# Patient Record
Sex: Male | Born: 1955
Health system: Southern US, Community
[De-identification: ages and names within clinical notes are randomized; demographics above are authoritative.]

## PROBLEM LIST (undated history)

## (undated) HISTORY — PX: OTHER SURGICAL HISTORY: SHX169

## (undated) HISTORY — PX: ROTATOR CUFF REPAIR: SHX139

---

## 2002-09-03 ENCOUNTER — Ambulatory Visit (HOSPITAL_COMMUNITY): Admission: RE | Admit: 2002-09-03 | Discharge: 2002-09-03 | Payer: Self-pay | Admitting: Internal Medicine

## 2004-11-14 ENCOUNTER — Ambulatory Visit (HOSPITAL_COMMUNITY): Admission: RE | Admit: 2004-11-14 | Discharge: 2004-11-14 | Payer: Self-pay | Admitting: Internal Medicine

## 2004-11-17 ENCOUNTER — Ambulatory Visit (HOSPITAL_COMMUNITY): Admission: RE | Admit: 2004-11-17 | Discharge: 2004-11-17 | Payer: Self-pay | Admitting: Internal Medicine

## 2004-11-24 ENCOUNTER — Ambulatory Visit: Payer: Self-pay | Admitting: Orthopedic Surgery

## 2005-01-05 ENCOUNTER — Ambulatory Visit: Payer: Self-pay | Admitting: Orthopedic Surgery

## 2005-01-10 ENCOUNTER — Encounter (HOSPITAL_COMMUNITY): Admission: RE | Admit: 2005-01-10 | Discharge: 2005-02-09 | Payer: Self-pay | Admitting: Orthopedic Surgery

## 2005-02-13 ENCOUNTER — Encounter (HOSPITAL_COMMUNITY): Admission: RE | Admit: 2005-02-13 | Discharge: 2005-03-15 | Payer: Self-pay | Admitting: Orthopedic Surgery

## 2005-02-16 ENCOUNTER — Ambulatory Visit: Payer: Self-pay | Admitting: Orthopedic Surgery

## 2005-02-21 ENCOUNTER — Ambulatory Visit: Payer: Self-pay | Admitting: Orthopedic Surgery

## 2005-02-21 ENCOUNTER — Ambulatory Visit (HOSPITAL_COMMUNITY): Admission: RE | Admit: 2005-02-21 | Discharge: 2005-02-21 | Payer: Self-pay | Admitting: Orthopedic Surgery

## 2005-02-27 ENCOUNTER — Encounter (HOSPITAL_COMMUNITY): Admission: RE | Admit: 2005-02-27 | Discharge: 2005-03-29 | Payer: Self-pay | Admitting: Orthopedic Surgery

## 2005-02-27 ENCOUNTER — Ambulatory Visit: Payer: Self-pay | Admitting: Orthopedic Surgery

## 2005-03-20 ENCOUNTER — Ambulatory Visit: Payer: Self-pay | Admitting: Orthopedic Surgery

## 2005-04-05 ENCOUNTER — Encounter (HOSPITAL_COMMUNITY): Admission: RE | Admit: 2005-04-05 | Discharge: 2005-05-05 | Payer: Self-pay | Admitting: Orthopedic Surgery

## 2005-04-12 ENCOUNTER — Ambulatory Visit: Payer: Self-pay | Admitting: Orthopedic Surgery

## 2005-05-05 ENCOUNTER — Encounter (HOSPITAL_COMMUNITY): Admission: RE | Admit: 2005-05-05 | Discharge: 2005-06-04 | Payer: Self-pay | Admitting: Orthopedic Surgery

## 2005-05-25 ENCOUNTER — Ambulatory Visit: Payer: Self-pay | Admitting: Orthopedic Surgery

## 2005-06-07 ENCOUNTER — Encounter (HOSPITAL_COMMUNITY): Admission: RE | Admit: 2005-06-07 | Discharge: 2005-07-07 | Payer: Self-pay | Admitting: Orthopedic Surgery

## 2005-06-22 ENCOUNTER — Ambulatory Visit: Payer: Self-pay | Admitting: Orthopedic Surgery

## 2005-07-13 ENCOUNTER — Ambulatory Visit: Payer: Self-pay | Admitting: Orthopedic Surgery

## 2008-01-29 ENCOUNTER — Ambulatory Visit: Payer: Self-pay | Admitting: Cardiology

## 2008-01-30 ENCOUNTER — Ambulatory Visit: Payer: Self-pay | Admitting: Cardiology

## 2008-01-30 ENCOUNTER — Encounter (HOSPITAL_COMMUNITY): Admission: RE | Admit: 2008-01-30 | Discharge: 2008-02-29 | Payer: Self-pay | Admitting: Cardiology

## 2008-02-03 ENCOUNTER — Ambulatory Visit: Payer: Self-pay | Admitting: Cardiology

## 2008-02-03 ENCOUNTER — Encounter: Payer: Self-pay | Admitting: Cardiology

## 2008-03-12 ENCOUNTER — Ambulatory Visit: Payer: Self-pay | Admitting: Internal Medicine

## 2008-03-16 ENCOUNTER — Ambulatory Visit (HOSPITAL_COMMUNITY): Admission: RE | Admit: 2008-03-16 | Discharge: 2008-03-16 | Payer: Self-pay | Admitting: Internal Medicine

## 2008-03-16 ENCOUNTER — Encounter: Payer: Self-pay | Admitting: Internal Medicine

## 2008-03-16 ENCOUNTER — Ambulatory Visit: Payer: Self-pay | Admitting: Internal Medicine

## 2008-04-15 ENCOUNTER — Ambulatory Visit: Payer: Self-pay | Admitting: Internal Medicine

## 2010-08-16 NOTE — Assessment & Plan Note (Signed)
[facility]                       [place] CARDIOLOGY OFFICE NOTE   James [name], James [name]                     MRN:          [id number]  DATE:01/29/2008                            DOB:          [birth year]    REQUESTING PHYSICIAN:  Kingsley Callander. Ouida Sills, MD   REASON FOR CONSULTATION:  Chest pain.   HISTORY OF PRESENT ILLNESS:  James [name] is a very pleasant 55 year old  male with a history of mild hyperlipidemia with LDL cholesterol of 140,  random glucose back in June of 102, and a family history of premature  cardiovascular disease.  His father underwent coronary artery bypass  surgery in his 68s, and I also see he had problems with pulmonary  fibrosis as well.  He has no personal history of diabetes mellitus or  hypertension and otherwise reports no major functional limitations.  He  and his wife have been in the middle of a move over the last few weeks,  and he has been experiencing a fairly sporadic knife-like intermittent  chest pain that is not predictably reproduced and typically lasts for  only a few seconds at a time.  Sometimes these pains will shoot up into  his neck or lip or into his shoulder blade or left arm, but never  persists more than a few seconds and have not gotten any worse when he  exerts himself, even lifting while moving.  One or two times during this  course he has felt a heaviness in his chest when he lies down.  He  reports a normal standard treadmill test approximately 4 years ago,  although he has had no interval testing.  He otherwise has no problems  with cough or hemoptysis, orthopnea, PND, palpitations, lower extremity  edema, or syncope.   ALLERGIES:  No known drug allergies.   PRESENT MEDICATIONS:  Aspirin 81 mg p.o. daily.   PAST MEDICAL HISTORY:  He is status post previous left rotator cuff  repair.  He has a history of microhematuria with negative workup, also  history of elevated transaminases with subsequent  normalization.   SOCIAL HISTORY:  The patient is married.  He and his wife have recently  been living with their daughter waiting on remodeling of a home to which  they have recently moved.  He denies any tobacco or alcohol use.  Previously, he ran regularly up to 5 or 6 miles a day, although is not  doing that anymore.   FAMILY HISTORY:  As reviewed above.  His mother had a history of  diabetes and hypertension, and he has a sister with history of Hodgkin  disease.   REVIEW OF SYSTEMS:  As outlined above.  He wears contacts.  Otherwise,  systems are negative.   PHYSICAL EXAMINATION:  VITAL SIGNS:  Blood pressure 126/78, heart rate  is 69, weight is 166 pounds.  GENERAL:  The patient is normally nourished, in no acute distress.  HEENT:  Conjunctiva is normal.  Oropharynx is clear.  NECK:  Supple.  No elevated jugular venous pressure.  No loud bruits.  No thyromegaly.  LUNGS:  Clear without labored breathing.  CARDIAC:  A regular rate and rhythm.  No pathologic murmur, S3, gallop,  or pericardial rub.  ABDOMEN:  Soft, nontender.  Normal bowel sounds.  EXTREMITIES:  Exhibit no significant pitting edemas.  Distal pulses are  2+.  SKIN:  Warm and dry.  MUSCULOSKELETAL:  No kyphosis noted.  NEUROPSYCHIATRIC:  The patient is alert and oriented x3.  Affect is  appropriate.   Review of electrocardiogram done just recently on January 26, 2008,  finds sinus rhythm at 60 beats per minute with normal intervals.   IMPRESSION AND RECOMMENDATIONS:  Atypical chest pain syndrome in a 24-  year-old male with mild hyperlipidemia and family history of premature  cardiovascular disease affecting his father.  These symptoms are noted  with the recent stress of moving, although are very concerning to him  given his father's history.  He has not had any ischemic workup over the  last 4 years.  There is also history of pulmonary fibrosis in his  father, although James [name] lung exam is fairly  benign.  He is not  having any cough or hemoptysis.  We talked about the matter today, and  our plan will be an exercise Myoview to be scheduled tomorrow along with  a resting echocardiogram mainly to exclude left ventricle wall motion  abnormalities and assess pulmonary pressures.  If these studies are  normal, I will plan to get him back to see Dr. Ouida Sills for further  evaluation, otherwise we can review the situation further and discuss  other diagnostic options.     Jonelle Sidle, MD  Electronically Signed    SGM/MedQ  DD: 01/29/2008  DT: 01/30/2008  Job #: [id number]   cc:   Kingsley Callander. Ouida Sills, MD

## 2010-08-16 NOTE — Assessment & Plan Note (Signed)
NAMEBIAGIO, SNELSON               CHART#:  [id number]   DATE:  04/15/2008                       DOB:  Feb 27, 1956   CHIEF COMPLAINT:  Followup of procedure.   SUBJECTIVE:  The patient is here for followup visit.  He has a 96-month  history of atypical chest pain with negative cardiac workup.  He also  had some vague odynophagia and dysphagia as well.  He underwent an EGD  for these symptoms as well as a colonoscopy for paper hematochezia  recently.  His esophagus was normal on EGD.  He had a Maloney dilator  passed, 54-French.  He had biopsies taken of the esophagus, which were  negative for eosinophilic esophagitis.  Colonoscopy revealed normal  rectum and terminal ileum.  He was given the 2-week trial of Kapidex.  He states he really did not notice any significant changes in his  symptoms.  He has only had 1 or 2 episodes of chest pain since he was  here last.  One time occurred after eating very spicy food and lasted  for about 2 days.  Denies any diaphoresis or shortness of breath with  these episodes.  He denies any abdominal pain.  No longer have any  problem swallowing.  Denies typical heartburn symptoms.  Symptoms seem  to have settled down since he was last here.  Bowel movements are  regular.  No blood in the stool or melena.   CURRENT MEDICATIONS:  See updated list.   ALLERGIES:  No known drug allergies.   PHYSICAL EXAMINATION:  VITAL SIGNS:  Weight 172, stable; temp 97.2;  blood pressure 120/88; and pulse 76.  GENERAL:  A pleasant, well-nourished, well-developed, Caucasian  gentleman in no acute distress.  SKIN:  Warm and dry.  No jaundice.  HEENT:  Sclerae nonicteric.  ABDOMEN:  Soft.   IMPRESSION:  1. The patient is a 55 year old gentleman with several months' history      of atypical chest pain.  Pain may be related to gastroesophageal      reflux disease.  He took a 2-week trial of Kapidex without any      overwhelming improvement of his symptoms.  He notes,  however, he      has had minimal symptoms since this procedure.  We discussed today      that if he has symptoms 2 or 3 times a week, I would like for him      to be on Kapidex or other PPIs such as Prilosec OTC or Prevacid OTC      on a daily basis for a couple of months to see if they helps or      not.  Would also consider gallbladder ultrasound to rule out occult      gallbladder disease if his symptoms become more recurrent or      progressive.  At this point in time, overall he is doing much      better.  Would also advise antireflux measures, which we discussed      today.  He has some      changes that he can make within his diet.  2. Office visit on a p.r.n. basis.       Tana Coast, P.A.  Electronically Signed     R. Roetta Sessions, M.D.  Electronically Signed  LL/MEDQ  D:  04/15/2008  T:  04/16/2008  Job:  [id number]   cc:   Kingsley Callander. Ouida Sills, MD

## 2010-08-16 NOTE — H&P (Signed)
NAMEPRIMUS, GRITTON              ACCOUNT NO.:  [id number]   MEDICAL RECORD NO.:  [id number]          PATIENT TYPE:  AMB   LOCATION:  DAY                           FACILITY:  [facility]   PHYSICIAN:  R. Roetta Sessions, M.D. DATE OF BIRTH:  Apr 09, 1955   DATE OF ADMISSION:  DATE OF DISCHARGE:  LH                              HISTORY & PHYSICAL   REASON FOR CONSULTATION:  Atypical chest pain.  The further evaluation  also screening colonoscopy.   HISTORY OF PRESENT ILLNESS:  Mr. Onofrio Klemp is a very pleasant 55-  year-old Caucasian male who resides here in [place], sent over  courtesy of Dr. Carylon Perches to further evaluate 79-month history of chest  discomfort.  He has had intermittent squeezing chest tightness, pressure  like sensation throughout his mid chest.  It waxed and waned without any  particular activity including exertion or changing of position, not  affected at all by eating or fasting.  He saw [facility] and had  an echo and nuclear stress test without any cardiac etiology being  found.  He does occasionally has some vague odynophagia symptoms as well  as dysphagia to solids and pills.  He really does not have any typical  symptoms of reflux, including retrosternal burning or regurgitation.  He  does not use tobacco or alcohol products.  He has not been on any acid  suppression therapy lately.  He denies any weight loss.  He has never  had his upper GI tract evaluated.  There is no prior history of  gastrointestinal illness.  It is of note, Mr. Arocho tells me that for  the past five or 6 years, he does sometimes wipe a slight amount of  blood after having a bowel movement.  He has never had any bleeding  around the stool or in the toilet water.  He has never had his lower GI  tract evaluated.  There is no family history of GI malignancy in any  first-degree relatives, although one of his grandmothers may have had  colon cancer.  He has never had his lower GI tract  evaluated.   LABORATORY EVALUATION:  Dr. Alonza Smoker office back in June of this year.  CBC completely normal.  Chem 20 normal except for slightly elevated  blood sugar of 102.  Total cholesterol 203.  LDL 140, HDL 45.  Urinalysis, trace blood.  PSA 0.39.   PAST MEDICAL HISTORY:  Benign recurrent hematuria at least 30 years  duration per patient.   PAST SURGERIES:  Urological procedure in the past.  He has had left  shoulder surgery.   CURRENT PRESCRIBED MEDICATIONS:  None.   OVER-THE-COUNTER MEDICATIONS:  None.   ALLERGIES:  No known drug allergies although he states he has gotten  nauseated after ANESTHESIA on occasion previously.   FAMILY HISTORY:  Mother died at age 25 of fluid around heart, diabetes,  hypertension.  Father died with pulmonary fibrosis age 46.  Otherwise no  history of chronic GI or liver illness aside from a 1 grandmother  possibly having colon cancer.   SOCIAL HISTORY:  Patient married,  has 3 healthy biological children and  1 step-child.  He is in his second marriage.  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  No tobacco or alcohol.  No  illicit drugs.   REVIEW OF SYSTEMS:  Has not had any night sweats, fever, chills.  Really  has not had any abdominal pain whatsoever.  He occasionally has diarrhea  but usually has regular bowel movements.  Some rectal bleeding with  wiping as outlined above.   PHYSICAL EXAMINATION:  CONSTITUTIONAL:  A very pleasant 55 year old  gentleman resting comfortably.  VITAL SIGNS:  Weight 173, height 5 feet 6-1/2 inches.  Temperature 98,  BP 130/76, pulse 72.  SKIN:  Warm and dry.  HEENT:  No scleral icterus.  Conjunctivae pink.  CHEST:  Lungs are clear to auscultation.  CARDIOVASCULAR:  Regular rate and rhythm without murmur, rub.  ABDOMEN:  Nondistended.  Positive bowel sounds.  Entirely soft and  nontender without appreciable mass or organomegaly.  EXTREMITIES:  No edema.  RECTAL:  Exam is  deferred to the time of colonoscopy.   IMPRESSION:  Mr. Webb Weed is a very pleasant 55 year old gentleman  with 21-month history of atypical chest pain, not likely cardiac in  etiology.  I share Dr. Alonza Smoker concerns this may well be  gastroesophageal reflux or other gastrointestinal problem.  He describes  vague odynophagia and dysphagia as well.  Sometimes occult gallbladder  disease can manifest primarily as chest pain.  However, I am glad to see  the has had a negative cardiac evaluation.  He has never had his lower  gastrointestinal tract evaluated.  He has had paper hematochezia for  years.  This is likely coming from a benign source but I also agree with  Dr. Ouida Sills that he has now crossed the threshold age for which at least a  screening colonoscopy is warranted.   RECOMMENDATIONS:  I have offered Mr. Utter both an EGD and colonoscopy  at the same time in the near future at [facility].  Risks,  benefits, alternatives, and limitations have been reviewed.  I told Mr.  Leonetti if he did have a web stricture or ring amenable to dilation, I  would plan to dilate his esophagus at that time, as he is having  symptoms.  His questions were answered.  He was agreeable.  We will plan  to perform an endoscopic evaluation in the very near future at Mr.  Comas convenience.  Make further recommendations once that has been  done.   I would like to thank Dr. Carylon Perches for allowing me to see this very  nice gentleman today in consultation.      Jonathon Bellows, M.D.  Electronically Signed     RMR/MEDQ  D:  03/12/2008  T:  03/12/2008  Job:  [id number]

## 2010-08-16 NOTE — Op Note (Signed)
NAMEJAMESEN, STAHNKE              ACCOUNT NO.:  [id number]   MEDICAL RECORD NO.:  [id number]          PATIENT TYPE:  AMB   LOCATION:  DAY                           FACILITY:  [facility]   PHYSICIAN:  R. Roetta Sessions, M.D. DATE OF BIRTH:  Jun 18, 1955   DATE OF PROCEDURE:  DATE OF DISCHARGE:                               OPERATIVE REPORT   INDICATIONS FOR PROCEDURE:  A 55 year old gentleman with recent 76-month  history of atypical chest pain, vague dysphagia to solids.  He is here  for further evaluation via EGD.  Also, he is here for screening  colonoscopy, in fact has had some paper hematochezia on occasion.  There  is no family history of GI malignancy in the first-degree relatives  although, he cites one grandmother possibly having colon cancer.  EGD  and colonoscopy now being done, potential for esophageal dilation,  biopsy, etc reviewed.  Risks, benefits, alternatives and limitations  discussed previously and again at the bedside, all parties questions  answered, all parties agreeable.   PROCEDURE NOTE:  O2 saturation, blood pressure, pulse, respirations were  monitored throughout the entirety of the procedure.   CONSCIOUS SEDATION:  Versed 6 mg IV, Demerol 100 mg IV in divided doses,  Zofran 4 mg IV in a single dose as prophylaxis against post procedure  nausea.  Cetacaine spray for topical pharyngeal anesthesia.   INSTRUMENT:  Pentax video chip system.   FINDINGS:  EGD examination of the tubular esophagus revealed entirely  normal mucosa.  EGD junction is easily traversed.  Stomach:  Gastric  cavity was emptied and insufflated well with air.  Thorough examination  of the gastric mucosa including retroflexed view of the proximal stomach  esophagogastric junction demonstrated no abnormalities.  Pylorus was  patent and easily traversed.  Examination of the bulb and second portion  revealed no abnormalities.  Therapeutic/diagnostic maneuvers performed.  Scope was withdrawn.  A  54-French Maloney dilator was passed in full  insertion with ease.  A look back revealed really no trauma associated  with those maneuvers.  Subsequently, biopsies of the distal and  midesophageal body were taken for histology study.  Patient tolerated  this procedure well and was prepared for colonoscopy.  Digital rectal  exam revealed no abnormalities.  Endoscopic findings:  Prep was good.  Colon:  Colonic mucosa was surveyed from the rectosigmoid junction  through the left transverse, right colon to the appendiceal orifice,  ileocecal valve, and cecum with the structures well seen, photographed  for the record.  Terminal ileum was intubated 10 cm.  From this level,  scope was slowly and consciously withdrawn.  All previously mentioned  mucosal surfaces were again seen.  The colonic mucosa as well as the  terminal ileum mucosa appeared normal.  The scope was pulled down the  rectum.  A thorough examination of the rectal mucosa including the  retroflexed view of the anal verge demonstrated no abnormalities.  The  patient tolerated the above procedures well and was reactive to  Endoscopy.   IMPRESSION:  Normal esophagus and stomach, D1 and D2, status post  passage of a  Maloney dilator and biopsy of the esophagus as described  above.  Colonoscopy findings; normal rectum, terminal ileum.  I suspect  trivial anorectal bleeding producing paper hematochezia.   Today, we found no obstructing lesion in the esophagus.  There were no  inflammatory lesions and the mucosa did not appear consistent with  eosinophilic esophagitis.   RECOMMENDATIONS:  1. Two week course of Kapidex 60 mg orally daily.  He is to go by my      office for free samples.  2. Repeat screening colonoscopy, 10 years.  3. If he has persisting symptoms to follow up.  We will consider a      gallbladder ultrasound to rule out gallbladder disease to complete      his GI workup.      Jonathon Bellows, M.D.  Electronically  Signed     RMR/MEDQ  D:  03/16/2008  T:  03/17/2008  Job:  [id number]   cc:   Kingsley Callander. Ouida Sills, MD  Fax: [phone]

## 2010-08-19 NOTE — H&P (Signed)
NAMEHENNING, EHLE              ACCOUNT NO.:  [id number]   MEDICAL RECORD NO.:  [id number]          PATIENT TYPE:  AMB   LOCATION:  DAY                           FACILITY:  [facility]   PHYSICIAN:  Vickki Hearing, M.D.DATE OF BIRTH:  1955-08-08   DATE OF ADMISSION:  DATE OF DISCHARGE:  LH                                HISTORY & PHYSICAL   CHIEF COMPLAINT:  Left shoulder pain.   HISTORY:  He is a 55 year old who presented with gradual onset of atraumatic  mild to moderate constant dull aching pain in his left shoulder. He is right  hand dominant. He swims and exercises. He has pain with frontal lap pull  downs. On presentation August 2006, he had had pain for eight weeks.  Modifying factors include overhead activity and sleeping on his left side  which increases his pain, and he has had some associated swelling over the  upper lateral left arm.   REVIEW OF SYSTEMS:  Ten-point system review normal except for  musculoskeletal system.   ALLERGIES:  None.   PAST MEDICAL HISTORY:  No surgeries, medical problems, pharmacy medications.  He is noted to have diabetes. His family physician is Dr. Ouida Sills.   SOCIAL HISTORY:  He is married. He is an Orthoptist. He does not  smoke, drink or use caffeine. College two years.   PHYSICAL EXAMINATION:  VITAL SIGNS:  Weight is 145. Pulse is 76. Respiratory  rate is 16.  GENERAL:  He is thin with normal development, grooming, hygiene, and  nutrition. He is alert and oriented x3. Neurological shows normal sensation  and reflexes. His pulses are normal. He has no venous stasis or edema.  SKIN:  No rashes.  MUSCULOSKELETAL:  Left shoulder:  He has lost range of motion in internal  rotation and flexion. He has normal strength and normal stability. He has  tenderness over the [facility] joint. His Neer and Hawkins signs were positive. He  has a normal opposite extremity and some mild C spine tenderness. His lower  extremities have normal range of  motion, alignment, stability and strength.   STUDIES:  His MRI of the shoulder done November 17, 2004 shows rotator cuff  tendinopathy, calcification anterior supraspinatus, no tear, small antral  labral tear degenerative in nature, mild AC joint arthritis with down  sloping acromion.   PLAN:  Operative plan is for surgical arthroscopy, left shoulder, with  subacromial decompression. It does not look like we will need to actually  remove the [facility] joint because he did not have a positive cross-chest adduction  test.      Vickki Hearing, M.D.  Electronically Signed     SEH/MEDQ  D:  02/20/2005  T:  02/20/2005  Job:  [id number]

## 2010-08-19 NOTE — Group Therapy Note (Signed)
   [name], [name]                          ACCOUNT NO.:  [id number]   MEDICAL RECORD NO.:  [id number]                  PATIENT TYPE:  POUT   LOCATION:                                       FACILITY:   PHYSICIAN:  Kingsley Callander. Ouida Sills, M.D.                  DATE OF BIRTH:   DATE OF PROCEDURE:  09/03/2002  DATE OF DISCHARGE:                                    STRESS TEST   PROCEDURE:  The patient exercised 14 minutes 9 seconds (2 minutes 9 seconds  at stage 5 of the Bruce protocol), obtaining a maximal heart rate of 185  (106% of the age predicted maximal heart rate) at a work load of 15.1 METT  and discontinued exercise due to fatigue.  There were no symptoms of chest  pain.  There were no arrhythmias.  There were no ST segment changes  diagnostic of ischemia.  The baseline electrocardiogram revealed normal  sinus rhythm at 80 beats per minute with minor early repolarization changes  in the inferior leads.   IMPRESSION:  No evidence of exercise-induced ischemia.                                               Kingsley Callander. Ouida Sills, M.D.    ROF/MEDQ  D:  09/03/2002  T:  09/03/2002  Job:  [id number]

## 2010-08-19 NOTE — Op Note (Signed)
NAMEBRAVEN, WOLK              ACCOUNT NO.:  [id number]   MEDICAL RECORD NO.:  [id number]          PATIENT TYPE:  AMB   LOCATION:  DAY                           FACILITY:  [facility]   PHYSICIAN:  Vickki Hearing, M.D.DATE OF BIRTH:  10-12-1955   DATE OF PROCEDURE:  02/21/2005  DATE OF DISCHARGE:                                 OPERATIVE REPORT   PREOPERATIVE DIAGNOSIS:  Rotator cuff syndrome, left shoulder.   POSTOPERATIVE DIAGNOSIS:  Rotator cuff syndrome, left shoulder.   PROCEDURE:  Arthroscopic subacromial decompression, left shoulder.   SURGEON:  Dr. Romeo Apple. No assistants.   ANESTHETIC:  General.   SPECIMENS:  No specimens.   OPERATIVE FINDINGS:  There was mild irritation of the glenohumeral joint.  The rotator cuff intra-articular surface was normal. The biceps tendon  anchor and tendon were normal. Subacromial space had a thickened bursal  drape with inflammation and some fibrosis. The superior surface of the  rotator cuff was intact though looked diseased. AC joint was not affecting  the cuff. Mild acromial spur was noted.   HISTORY:  This is a 55 year old male. He was followed for left shoulder  pain. He did not have an injury. He had a dull, constant, aching pain on the  upper outer portion of his left arm. He had overhead activity related pain  and pain was sleeping on his left side. He failed conservative treatment  including a subacromial injection. He had a MRI August 17 which showed  rotator cuff tendinopathy, no tear, small anterior labral degenerative  changes, mild AC joint arthritis with downsloping acromion.   He was identified in preop holding area, left shoulder marked for surgery,  countersigned by the surgeon. History and physical updated. Antibiotics  started. He was taken to the operating room. He had general anesthesia. He  was placed in a modified beach-chair position. We took a time-out and  completed that as required and proceeded with a  posterior approach to the  shoulder. We made a portal and entered the glenohumeral joint and performed  a diagnostic arthroscopy   We were having some trouble with the pump function at that time, but it was  later corrected. We placed a scope in the subacromial space, encountered a  significantly thickened and fibrotic bursal drape with inflammation. We  debrided and resected the bursa until the rotator cuff was visualized. We  manipulated the arm in internal and external rotation to get a good view of  the cuff; it was intact. We performed a subacromial decompression of the  anterior acromion to the level of the [facility] joint. No planing was done. The [facility]  joint appeared to be normal from the subarticular side.   We then placed the scope back in the joint to get a better look at the  glenohumeral joint and found no disease of the supraspinatus tendon, biceps,  biceps anchor and just found inflammation of the joint.   We removed the scope, injected subacromial Marcaine with epinephrine  approximately 20 cc. We sewed the skin together with 3-0 Prolene. We placed  the patient in a CryoCuff  combination sling, extubated him and took him to  the recovery room in stable condition.   We will discharge him today from the recovery room. He will be placed on  Percocet 5 mg 1 every 4 hours #60, Skelaxin 800 mg 1 every 8 hours #90,  Phenergan 25 mg oral q.6h. #12 p.r.n. nausea. Follow-up will be scheduled  for Monday. We would like him to start therapy on Friday if it is available,  and if not, we will start that on the next available date after that.      Vickki Hearing, M.D.  Electronically Signed     SEH/MEDQ  D:  02/21/2005  T:  02/21/2005  Job:  [id number]

## 2011-03-15 ENCOUNTER — Encounter: Payer: Self-pay | Admitting: Cardiology

## 2012-01-12 ENCOUNTER — Other Ambulatory Visit (HOSPITAL_COMMUNITY): Payer: Self-pay | Admitting: Internal Medicine

## 2012-01-12 ENCOUNTER — Ambulatory Visit (HOSPITAL_COMMUNITY)
Admission: RE | Admit: 2012-01-12 | Discharge: 2012-01-12 | Disposition: A | Discharge status: Home / Self Care | Payer: 59 | Source: Ambulatory Visit | Attending: Internal Medicine | Admitting: Internal Medicine

## 2012-01-12 DIAGNOSIS — X58XXXA Exposure to other specified factors, initial encounter: Secondary | ICD-10-CM | POA: Insufficient documentation

## 2012-01-12 DIAGNOSIS — S92919A Unspecified fracture of unspecified toe(s), initial encounter for closed fracture: Secondary | ICD-10-CM | POA: Insufficient documentation

## 2012-01-12 DIAGNOSIS — M79671 Pain in right foot: Secondary | ICD-10-CM

## 2013-03-21 ENCOUNTER — Ambulatory Visit (HOSPITAL_COMMUNITY)
Admission: RE | Admit: 2013-03-21 | Discharge: 2013-03-21 | Disposition: A | Discharge status: Home / Self Care | Payer: 59 | Source: Ambulatory Visit | Attending: Internal Medicine | Admitting: Internal Medicine

## 2013-03-21 ENCOUNTER — Other Ambulatory Visit (HOSPITAL_COMMUNITY): Payer: Self-pay | Admitting: Internal Medicine

## 2013-03-21 DIAGNOSIS — R059 Cough, unspecified: Secondary | ICD-10-CM

## 2013-03-21 DIAGNOSIS — R0989 Other specified symptoms and signs involving the circulatory and respiratory systems: Secondary | ICD-10-CM | POA: Insufficient documentation

## 2013-03-21 DIAGNOSIS — R05 Cough: Secondary | ICD-10-CM

## 2013-03-21 DIAGNOSIS — M4120 Other idiopathic scoliosis, site unspecified: Secondary | ICD-10-CM | POA: Insufficient documentation

## 2015-05-21 ENCOUNTER — Encounter (HOSPITAL_COMMUNITY): Payer: Self-pay | Admitting: *Deleted

## 2015-05-21 ENCOUNTER — Emergency Department (HOSPITAL_COMMUNITY)
Admission: EM | Admit: 2015-05-21 | Discharge: 2015-05-21 | Disposition: A | Discharge status: Home / Self Care | Payer: BLUE CROSS/BLUE SHIELD | Attending: Emergency Medicine | Admitting: Emergency Medicine

## 2015-05-21 DIAGNOSIS — S6991XA Unspecified injury of right wrist, hand and finger(s), initial encounter: Secondary | ICD-10-CM | POA: Diagnosis present

## 2015-05-21 DIAGNOSIS — Y9389 Activity, other specified: Secondary | ICD-10-CM | POA: Insufficient documentation

## 2015-05-21 DIAGNOSIS — S61219A Laceration without foreign body of unspecified finger without damage to nail, initial encounter: Secondary | ICD-10-CM

## 2015-05-21 DIAGNOSIS — Z79899 Other long term (current) drug therapy: Secondary | ICD-10-CM | POA: Diagnosis not present

## 2015-05-21 DIAGNOSIS — Z87891 Personal history of nicotine dependence: Secondary | ICD-10-CM | POA: Insufficient documentation

## 2015-05-21 DIAGNOSIS — W260XXA Contact with knife, initial encounter: Secondary | ICD-10-CM | POA: Insufficient documentation

## 2015-05-21 DIAGNOSIS — S61216A Laceration without foreign body of right little finger without damage to nail, initial encounter: Secondary | ICD-10-CM | POA: Diagnosis not present

## 2015-05-21 DIAGNOSIS — Y998 Other external cause status: Secondary | ICD-10-CM | POA: Insufficient documentation

## 2015-05-21 DIAGNOSIS — Y9289 Other specified places as the place of occurrence of the external cause: Secondary | ICD-10-CM | POA: Insufficient documentation

## 2015-05-21 MED ORDER — LIDOCAINE HCL (PF) 1 % IJ SOLN
INTRAMUSCULAR | Status: AC
  Administered 2015-05-21: 22:00:00
  Filled 2015-05-21: qty 5

## 2015-05-21 MED ORDER — POVIDONE-IODINE 10 % EX SOLN
CUTANEOUS | Status: AC
  Administered 2015-05-21: 22:00:00
  Filled 2015-05-21: qty 118

## 2015-05-21 MED ORDER — ONDANSETRON 4 MG PO TBDP
4.0000 mg | ORAL_TABLET | Freq: Once | ORAL | Status: AC
  Administered 2015-05-21: 4 mg via ORAL
  Filled 2015-05-21: qty 1

## 2015-05-21 NOTE — ED Notes (Signed)
Pt was putting a knife down when the knife slipped, cutting left pinkie finger, bleeding controlled at triage,

## 2015-05-21 NOTE — ED Notes (Signed)
Dry dressing applied to right small finger

## 2015-05-21 NOTE — ED Provider Notes (Signed)
CSN: [id number]     Arrival date & time 05/21/15  2039 History   First MD Initiated Contact with Patient 05/21/15 2144     Chief Complaint  Patient presents with  . Laceration     (Consider location/radiation/quality/duration/timing/severity/associated sxs/prior Treatment) Patient is a 60 y.o. male presenting with skin laceration. The history is provided by the patient.  Laceration Location:  Hand Hand laceration location:  R finger Length (cm):  2.2 Depth:  Cutaneous Quality: straight   Bleeding: controlled   Laceration mechanism:  Knife Pain details:    Quality:  Aching   Severity:  Mild   Timing:  Intermittent   Progression:  Worsening Foreign body present:  No foreign bodies Worsened by:  Movement Tetanus status:  Up to date   Past Medical History  Diagnosis Date  . Chest pain    Past Surgical History  Procedure Laterality Date  . Rotator cuff repair      Left   No family history on file. Social History  Substance Use Topics  . Smoking status: Former Research scientist (life sciences)  . Smokeless tobacco: None  . Alcohol Use: No    Review of Systems  Constitutional: Negative for activity change.       All ROS Neg except as noted in HPI  HENT: Negative for nosebleeds.   Eyes: Negative for photophobia and discharge.  Respiratory: Negative for cough, shortness of breath and wheezing.   Cardiovascular: Negative for chest pain and palpitations.  Gastrointestinal: Negative for abdominal pain and blood in stool.  Genitourinary: Negative for dysuria, frequency and hematuria.  Musculoskeletal: Negative for back pain, arthralgias and neck pain.  Skin: Negative.   Neurological: Negative for dizziness, seizures and speech difficulty.  Psychiatric/Behavioral: Negative for hallucinations and confusion.      Allergies  Codeine  Home Medications   Prior to Admission medications   Medication Sig Start Date End Date Taking? Authorizing Provider  escitalopram (LEXAPRO) 10 MG tablet Take  10 mg by mouth daily. 05/06/15  Yes Historical Provider, MD  LORazepam (ATIVAN) 0.5 MG tablet Take 0.125 tablets by mouth daily as needed.  04/07/15  Yes Historical Provider, MD   BP 140/83 mmHg  Pulse 67  Temp(Src) 98.4 F (36.9 C) (Oral)  Resp 16  Ht 5\' 6"  (1.676 m)  Wt 68.493 kg  BMI 24.38 kg/m2  SpO2 99% Physical Exam  Constitutional: He is oriented to person, place, and time. He appears well-developed and well-nourished.  Non-toxic appearance.  HENT:  Head: Normocephalic.  Right Ear: Tympanic membrane and external ear normal.  Left Ear: Tympanic membrane and external ear normal.  Eyes: EOM and lids are normal. Pupils are equal, round, and reactive to light.  Neck: Normal range of motion. Neck supple. Carotid bruit is not present.  Cardiovascular: Normal rate, regular rhythm, normal heart sounds, intact distal pulses and normal pulses.   Pulmonary/Chest: Breath sounds normal. No respiratory distress.  Abdominal: Soft. Bowel sounds are normal. There is no tenderness. There is no guarding.  Musculoskeletal: Normal range of motion.       Right hand: He exhibits laceration. He exhibits normal range of motion and normal capillary refill.       Left hand: Normal sensation noted. Normal strength noted.       Hands: Lymphadenopathy:       Head (right side): No submandibular adenopathy present.       Head (left side): No submandibular adenopathy present.    He has no cervical adenopathy.  Neurological: He is  alert and oriented to person, place, and time. He has normal strength. No cranial nerve deficit or sensory deficit.  Skin: Skin is warm and dry.  Psychiatric: He has a normal mood and affect. His speech is normal.  Nursing note and vitals reviewed.   ED Course  .Marland KitchenLaceration Repair Date/Time: 05/21/2015 2:20 PM Performed by: Lily Kocher Authorized by: Lily Kocher Consent: Verbal consent obtained. Risks and benefits: risks, benefits and alternatives were discussed Consent  given by: patient Patient understanding: patient states understanding of the procedure being performed Patient identity confirmed: arm band Time out: Immediately prior to procedure a "time out" was called to verify the correct patient, procedure, equipment, support staff and site/side marked as required. Body area: upper extremity Location details: right small finger Laceration length: 2.2 cm Foreign bodies: no foreign bodies Tendon involvement: none Local anesthetic: lidocaine 1% without epinephrine Patient sedated: no Preparation: Patient was prepped and draped in the usual sterile fashion. Irrigation solution: saline Skin closure: 4-0 nylon Number of sutures: 4 Technique: simple Approximation: close Approximation difficulty: simple Dressing: gauze roll Patient tolerance: Patient tolerated the procedure well with no immediate complications   (including critical care time) Labs Review Labs Reviewed - No data to display  Imaging Review No results found. I have personally reviewed and evaluated these images and lab results as part of my medical decision-making.   EKG Interpretation None      MDM  Vital signs are well within normal limits. Patient sustained a laceration from a.m. to the left fifth finger. This was repaired with 4 interrupted sutures of 4-0 nylon. The patient is been advised to have these removed on February 25. The patient will return sooner if any signs of infection.    Final diagnoses:  Laceration of finger of right hand, initial encounter    *I have reviewed nursing notes, vital signs, and all appropriate lab and imaging results for this patient.[address] PA-C 05/24/15 [place], MD 05/26/15 [phone]

## 2015-05-21 NOTE — Discharge Instructions (Signed)
The laceration to your right hand was repaired with 4 sutures. Please have these removed on or after this for 25. Please keep the wound clean and dry. Please see your primary physician, or return to the emergency department if any signs of infection. Stitches, Staples, or Adhesive Wound Closure Doctors use stitches (sutures), staples, and certain glue (skin adhesives) to hold your skin together while it heals (wound closure). You may need this treatment after you have surgery or if you cut your skin accidentally. These methods help your skin heal more quickly. They also make it less likely that you will have a scar. WHAT ARE THE DIFFERENT KINDS OF WOUND CLOSURES? There are many options for wound closure. The one that your doctor uses depends on how deep and large your wound is. Adhesive Glue To use this glue to close a wound, your doctor holds the edges of the wound together and paints the glue on the surface of your skin. You may need more than one layer of glue. Then the wound may be covered with a light bandage (dressing). This type of skin closure may be used for small wounds that are not deep (superficial). Using glue for wound closure is less painful than other methods. It does not require a medicine that numbs the area. This method also leaves nothing to be removed. Adhesive glue is often used for children and on facial wounds. Adhesive glue cannot be used for wounds that are deep, uneven, or bleeding. It is not used inside of a wound.  Adhesive Strips These strips are made of sticky (adhesive), porous paper. They are placed across your skin edges like a regular adhesive bandage. You leave them on until they fall off. Adhesive strips may be used to close very superficial wounds. They may also be used along with sutures to improve closure of your skin edges.  Sutures Sutures are the oldest method of wound closure. Sutures can be made from natural or synthetic materials. They can be made from a  material that your body can break down as your wound heals (absorbable), or they can be made from a material that needs to be removed from your skin (nonabsorbable). They come in many different strengths and sizes. Your doctor attaches the sutures to a steel needle on one end. Sutures can be passed through your skin, or through the tissues beneath your skin. Then they are tied and cut. Your skin edges may be closed in one continuous stitch or in separate stitches. Sutures are strong and can be used for all kinds of wounds. Absorbable sutures may be used to close tissues under the skin. The disadvantage of sutures is that they may cause skin reactions that lead to infection. Nonabsorbable sutures need to be removed. Staples When surgical staples are used to close a wound, the edges of your skin on both sides of the wound are brought close together. A staple is placed across the wound, and an instrument secures the edges together. Staples are often used to close surgical cuts (incisions). Staples are faster to use than sutures, and they cause less reaction from your skin. Staples need to be removed using a tool that bends the staples away from your skin. HOW DO I CARE FOR MY WOUND CLOSURE?  Take medicines only as told by your doctor.  If you were prescribed an antibiotic medicine for your wound, finish it all even if you start to feel better.  Use ointments or creams only as told by your doctor.  Wash your hands with soap and water before and after touching your wound.  Do not soak your wound in water. Do not take baths, swim, or use a hot tub until your doctor says it is okay.  Ask your doctor when you can start showering. Cover your wound if told by your doctor.  Do not take out your own sutures or staples.  Do not pick at your wound. Picking can cause an infection.  Keep all follow-up visits as told by your doctor. This is important. HOW LONG WILL I HAVE MY WOUND CLOSURE?   Leave adhesive  glue on your skin until the glue peels away.  Leave adhesive strips on your skin until they fall off.  Absorbable sutures will dissolve within several days.  Nonabsorbable sutures and staples must be removed. The location of the wound will determine how long they stay in. This can range from several days to a couple of weeks. WHEN SHOULD I SEEK HELP FOR MY WOUND CLOSURE? Contact your doctor if:  You have a fever.  You have chills.  You have redness, puffiness (swelling), or pain at the site of your wound.  You have fluid, blood, or pus coming from your wound.  There is a bad smell coming from your wound.  The skin edges of your wound start to separate after your sutures have been removed.  Your wound becomes thick, raised, and darker in color after your sutures come out (scarring).   This information is not intended to replace advice given to you by your health care provider. Make sure you discuss any questions you have with your health care provider.   Document Released: 01/15/2009 Document Revised: 04/10/2014 Document Reviewed: 08/27/2013 Elsevier Interactive Patient Education [employer].

## 2015-08-26 DIAGNOSIS — E291 Testicular hypofunction: Secondary | ICD-10-CM | POA: Diagnosis not present

## 2015-08-26 DIAGNOSIS — Z125 Encounter for screening for malignant neoplasm of prostate: Secondary | ICD-10-CM | POA: Diagnosis not present

## 2015-08-26 DIAGNOSIS — Z79899 Other long term (current) drug therapy: Secondary | ICD-10-CM | POA: Diagnosis not present

## 2015-08-26 DIAGNOSIS — E785 Hyperlipidemia, unspecified: Secondary | ICD-10-CM | POA: Diagnosis not present

## 2015-09-02 DIAGNOSIS — E785 Hyperlipidemia, unspecified: Secondary | ICD-10-CM | POA: Diagnosis not present

## 2015-09-02 DIAGNOSIS — Z0001 Encounter for general adult medical examination with abnormal findings: Secondary | ICD-10-CM | POA: Diagnosis not present

## 2015-09-02 DIAGNOSIS — R002 Palpitations: Secondary | ICD-10-CM | POA: Diagnosis not present

## 2015-09-02 DIAGNOSIS — E291 Testicular hypofunction: Secondary | ICD-10-CM | POA: Diagnosis not present

## 2015-09-16 DIAGNOSIS — D0362 Melanoma in situ of left upper limb, including shoulder: Secondary | ICD-10-CM | POA: Diagnosis not present

## 2015-09-16 DIAGNOSIS — D0359 Melanoma in situ of other part of trunk: Secondary | ICD-10-CM | POA: Diagnosis not present

## 2015-09-16 DIAGNOSIS — L570 Actinic keratosis: Secondary | ICD-10-CM | POA: Diagnosis not present

## 2015-09-16 DIAGNOSIS — D225 Melanocytic nevi of trunk: Secondary | ICD-10-CM | POA: Diagnosis not present

## 2015-09-16 DIAGNOSIS — D0439 Carcinoma in situ of skin of other parts of face: Secondary | ICD-10-CM | POA: Diagnosis not present

## 2015-09-16 DIAGNOSIS — X32XXXA Exposure to sunlight, initial encounter: Secondary | ICD-10-CM | POA: Diagnosis not present

## 2015-09-16 DIAGNOSIS — Z1283 Encounter for screening for malignant neoplasm of skin: Secondary | ICD-10-CM | POA: Diagnosis not present

## 2015-09-24 DIAGNOSIS — J019 Acute sinusitis, unspecified: Secondary | ICD-10-CM | POA: Diagnosis not present

## 2015-09-27 DIAGNOSIS — D485 Neoplasm of uncertain behavior of skin: Secondary | ICD-10-CM | POA: Diagnosis not present

## 2015-09-27 DIAGNOSIS — D0359 Melanoma in situ of other part of trunk: Secondary | ICD-10-CM | POA: Diagnosis not present

## 2015-09-27 DIAGNOSIS — L918 Other hypertrophic disorders of the skin: Secondary | ICD-10-CM | POA: Diagnosis not present

## 2015-09-27 DIAGNOSIS — L089 Local infection of the skin and subcutaneous tissue, unspecified: Secondary | ICD-10-CM | POA: Diagnosis not present

## 2015-09-27 DIAGNOSIS — L905 Scar conditions and fibrosis of skin: Secondary | ICD-10-CM | POA: Diagnosis not present

## 2015-09-27 DIAGNOSIS — L820 Inflamed seborrheic keratosis: Secondary | ICD-10-CM | POA: Diagnosis not present

## 2015-10-07 DIAGNOSIS — Z08 Encounter for follow-up examination after completed treatment for malignant neoplasm: Secondary | ICD-10-CM | POA: Diagnosis not present

## 2015-10-07 DIAGNOSIS — Z85828 Personal history of other malignant neoplasm of skin: Secondary | ICD-10-CM | POA: Diagnosis not present

## 2015-11-16 DIAGNOSIS — M26621 Arthralgia of right temporomandibular joint: Secondary | ICD-10-CM | POA: Diagnosis not present

## 2015-11-16 DIAGNOSIS — R6884 Jaw pain: Secondary | ICD-10-CM | POA: Diagnosis not present

## 2016-01-06 DIAGNOSIS — X32XXXD Exposure to sunlight, subsequent encounter: Secondary | ICD-10-CM | POA: Diagnosis not present

## 2016-01-06 DIAGNOSIS — L570 Actinic keratosis: Secondary | ICD-10-CM | POA: Diagnosis not present

## 2016-01-06 DIAGNOSIS — Z85820 Personal history of malignant melanoma of skin: Secondary | ICD-10-CM | POA: Diagnosis not present

## 2016-01-06 DIAGNOSIS — Z08 Encounter for follow-up examination after completed treatment for malignant neoplasm: Secondary | ICD-10-CM | POA: Diagnosis not present

## 2016-01-06 DIAGNOSIS — Z1283 Encounter for screening for malignant neoplasm of skin: Secondary | ICD-10-CM | POA: Diagnosis not present

## 2016-03-03 DIAGNOSIS — F411 Generalized anxiety disorder: Secondary | ICD-10-CM | POA: Diagnosis not present

## 2016-03-30 DIAGNOSIS — H43812 Vitreous degeneration, left eye: Secondary | ICD-10-CM | POA: Diagnosis not present

## 2016-04-06 DIAGNOSIS — L723 Sebaceous cyst: Secondary | ICD-10-CM | POA: Diagnosis not present

## 2016-04-06 DIAGNOSIS — D0439 Carcinoma in situ of skin of other parts of face: Secondary | ICD-10-CM | POA: Diagnosis not present

## 2016-04-06 DIAGNOSIS — D225 Melanocytic nevi of trunk: Secondary | ICD-10-CM | POA: Diagnosis not present

## 2016-04-06 DIAGNOSIS — Z1283 Encounter for screening for malignant neoplasm of skin: Secondary | ICD-10-CM | POA: Diagnosis not present

## 2016-04-06 DIAGNOSIS — Z85820 Personal history of malignant melanoma of skin: Secondary | ICD-10-CM | POA: Diagnosis not present

## 2016-04-06 DIAGNOSIS — L080 Pyoderma: Secondary | ICD-10-CM | POA: Diagnosis not present

## 2016-04-06 DIAGNOSIS — Z08 Encounter for follow-up examination after completed treatment for malignant neoplasm: Secondary | ICD-10-CM | POA: Diagnosis not present

## 2016-05-26 DIAGNOSIS — H33312 Horseshoe tear of retina without detachment, left eye: Secondary | ICD-10-CM | POA: Diagnosis not present

## 2016-07-13 DIAGNOSIS — Z1283 Encounter for screening for malignant neoplasm of skin: Secondary | ICD-10-CM | POA: Diagnosis not present

## 2016-07-13 DIAGNOSIS — L570 Actinic keratosis: Secondary | ICD-10-CM | POA: Diagnosis not present

## 2016-07-13 DIAGNOSIS — C44519 Basal cell carcinoma of skin of other part of trunk: Secondary | ICD-10-CM | POA: Diagnosis not present

## 2016-07-13 DIAGNOSIS — Z85828 Personal history of other malignant neoplasm of skin: Secondary | ICD-10-CM | POA: Diagnosis not present

## 2016-07-13 DIAGNOSIS — X32XXXD Exposure to sunlight, subsequent encounter: Secondary | ICD-10-CM | POA: Diagnosis not present

## 2016-07-13 DIAGNOSIS — Z08 Encounter for follow-up examination after completed treatment for malignant neoplasm: Secondary | ICD-10-CM | POA: Diagnosis not present

## 2016-09-04 DIAGNOSIS — Z0001 Encounter for general adult medical examination with abnormal findings: Secondary | ICD-10-CM | POA: Diagnosis not present

## 2016-09-04 DIAGNOSIS — Z125 Encounter for screening for malignant neoplasm of prostate: Secondary | ICD-10-CM | POA: Diagnosis not present

## 2016-09-11 DIAGNOSIS — Z0001 Encounter for general adult medical examination with abnormal findings: Secondary | ICD-10-CM | POA: Diagnosis not present

## 2016-09-11 DIAGNOSIS — Z6828 Body mass index (BMI) 28.0-28.9, adult: Secondary | ICD-10-CM | POA: Diagnosis not present

## 2016-09-11 DIAGNOSIS — E291 Testicular hypofunction: Secondary | ICD-10-CM | POA: Diagnosis not present

## 2016-09-11 DIAGNOSIS — I10 Essential (primary) hypertension: Secondary | ICD-10-CM | POA: Diagnosis not present

## 2016-09-25 DIAGNOSIS — X32XXXD Exposure to sunlight, subsequent encounter: Secondary | ICD-10-CM | POA: Diagnosis not present

## 2016-09-25 DIAGNOSIS — D225 Melanocytic nevi of trunk: Secondary | ICD-10-CM | POA: Diagnosis not present

## 2016-09-25 DIAGNOSIS — C44319 Basal cell carcinoma of skin of other parts of face: Secondary | ICD-10-CM | POA: Diagnosis not present

## 2016-09-25 DIAGNOSIS — Z85820 Personal history of malignant melanoma of skin: Secondary | ICD-10-CM | POA: Diagnosis not present

## 2016-09-25 DIAGNOSIS — Z1283 Encounter for screening for malignant neoplasm of skin: Secondary | ICD-10-CM | POA: Diagnosis not present

## 2016-09-25 DIAGNOSIS — D0462 Carcinoma in situ of skin of left upper limb, including shoulder: Secondary | ICD-10-CM | POA: Diagnosis not present

## 2016-09-25 DIAGNOSIS — Z08 Encounter for follow-up examination after completed treatment for malignant neoplasm: Secondary | ICD-10-CM | POA: Diagnosis not present

## 2016-09-25 DIAGNOSIS — L570 Actinic keratosis: Secondary | ICD-10-CM | POA: Diagnosis not present

## 2016-10-16 DIAGNOSIS — B078 Other viral warts: Secondary | ICD-10-CM | POA: Diagnosis not present

## 2016-10-16 DIAGNOSIS — Z85820 Personal history of malignant melanoma of skin: Secondary | ICD-10-CM | POA: Diagnosis not present

## 2016-10-16 DIAGNOSIS — Z08 Encounter for follow-up examination after completed treatment for malignant neoplasm: Secondary | ICD-10-CM | POA: Diagnosis not present

## 2016-10-16 DIAGNOSIS — Z85828 Personal history of other malignant neoplasm of skin: Secondary | ICD-10-CM | POA: Diagnosis not present

## 2016-12-21 DIAGNOSIS — Z1283 Encounter for screening for malignant neoplasm of skin: Secondary | ICD-10-CM | POA: Diagnosis not present

## 2016-12-21 DIAGNOSIS — Z85820 Personal history of malignant melanoma of skin: Secondary | ICD-10-CM | POA: Diagnosis not present

## 2016-12-21 DIAGNOSIS — X32XXXD Exposure to sunlight, subsequent encounter: Secondary | ICD-10-CM | POA: Diagnosis not present

## 2016-12-21 DIAGNOSIS — D225 Melanocytic nevi of trunk: Secondary | ICD-10-CM | POA: Diagnosis not present

## 2016-12-21 DIAGNOSIS — D487 Neoplasm of uncertain behavior of other specified sites: Secondary | ICD-10-CM | POA: Diagnosis not present

## 2016-12-21 DIAGNOSIS — Z08 Encounter for follow-up examination after completed treatment for malignant neoplasm: Secondary | ICD-10-CM | POA: Diagnosis not present

## 2016-12-21 DIAGNOSIS — D485 Neoplasm of uncertain behavior of skin: Secondary | ICD-10-CM | POA: Diagnosis not present

## 2016-12-21 DIAGNOSIS — L570 Actinic keratosis: Secondary | ICD-10-CM | POA: Diagnosis not present

## 2017-01-25 DIAGNOSIS — Z23 Encounter for immunization: Secondary | ICD-10-CM | POA: Diagnosis not present

## 2017-03-21 DIAGNOSIS — Z85820 Personal history of malignant melanoma of skin: Secondary | ICD-10-CM | POA: Diagnosis not present

## 2017-03-21 DIAGNOSIS — E291 Testicular hypofunction: Secondary | ICD-10-CM | POA: Diagnosis not present

## 2017-03-21 DIAGNOSIS — F419 Anxiety disorder, unspecified: Secondary | ICD-10-CM | POA: Diagnosis not present

## 2017-03-21 DIAGNOSIS — L570 Actinic keratosis: Secondary | ICD-10-CM | POA: Diagnosis not present

## 2017-03-21 DIAGNOSIS — Z08 Encounter for follow-up examination after completed treatment for malignant neoplasm: Secondary | ICD-10-CM | POA: Diagnosis not present

## 2017-03-21 DIAGNOSIS — X32XXXD Exposure to sunlight, subsequent encounter: Secondary | ICD-10-CM | POA: Diagnosis not present

## 2017-03-21 DIAGNOSIS — Z1283 Encounter for screening for malignant neoplasm of skin: Secondary | ICD-10-CM | POA: Diagnosis not present

## 2017-06-18 DIAGNOSIS — Z85820 Personal history of malignant melanoma of skin: Secondary | ICD-10-CM | POA: Diagnosis not present

## 2017-06-18 DIAGNOSIS — Z1283 Encounter for screening for malignant neoplasm of skin: Secondary | ICD-10-CM | POA: Diagnosis not present

## 2017-06-18 DIAGNOSIS — D225 Melanocytic nevi of trunk: Secondary | ICD-10-CM | POA: Diagnosis not present

## 2017-06-18 DIAGNOSIS — L820 Inflamed seborrheic keratosis: Secondary | ICD-10-CM | POA: Diagnosis not present

## 2017-06-18 DIAGNOSIS — X32XXXD Exposure to sunlight, subsequent encounter: Secondary | ICD-10-CM | POA: Diagnosis not present

## 2017-06-18 DIAGNOSIS — L570 Actinic keratosis: Secondary | ICD-10-CM | POA: Diagnosis not present

## 2017-06-18 DIAGNOSIS — Z08 Encounter for follow-up examination after completed treatment for malignant neoplasm: Secondary | ICD-10-CM | POA: Diagnosis not present

## 2017-09-20 DIAGNOSIS — L918 Other hypertrophic disorders of the skin: Secondary | ICD-10-CM | POA: Diagnosis not present

## 2017-09-20 DIAGNOSIS — D225 Melanocytic nevi of trunk: Secondary | ICD-10-CM | POA: Diagnosis not present

## 2017-09-20 DIAGNOSIS — X32XXXD Exposure to sunlight, subsequent encounter: Secondary | ICD-10-CM | POA: Diagnosis not present

## 2017-09-20 DIAGNOSIS — Z08 Encounter for follow-up examination after completed treatment for malignant neoplasm: Secondary | ICD-10-CM | POA: Diagnosis not present

## 2017-09-20 DIAGNOSIS — Z85820 Personal history of malignant melanoma of skin: Secondary | ICD-10-CM | POA: Diagnosis not present

## 2017-09-20 DIAGNOSIS — L570 Actinic keratosis: Secondary | ICD-10-CM | POA: Diagnosis not present

## 2017-09-20 DIAGNOSIS — Z1283 Encounter for screening for malignant neoplasm of skin: Secondary | ICD-10-CM | POA: Diagnosis not present

## 2017-09-25 DIAGNOSIS — E291 Testicular hypofunction: Secondary | ICD-10-CM | POA: Diagnosis not present

## 2017-09-25 DIAGNOSIS — Z125 Encounter for screening for malignant neoplasm of prostate: Secondary | ICD-10-CM | POA: Diagnosis not present

## 2017-09-25 DIAGNOSIS — E785 Hyperlipidemia, unspecified: Secondary | ICD-10-CM | POA: Diagnosis not present

## 2017-09-25 DIAGNOSIS — Z0001 Encounter for general adult medical examination with abnormal findings: Secondary | ICD-10-CM | POA: Diagnosis not present

## 2017-10-02 DIAGNOSIS — E785 Hyperlipidemia, unspecified: Secondary | ICD-10-CM | POA: Diagnosis not present

## 2017-10-02 DIAGNOSIS — Z0001 Encounter for general adult medical examination with abnormal findings: Secondary | ICD-10-CM | POA: Diagnosis not present

## 2017-10-02 DIAGNOSIS — Z6830 Body mass index (BMI) 30.0-30.9, adult: Secondary | ICD-10-CM | POA: Diagnosis not present

## 2017-10-02 DIAGNOSIS — F419 Anxiety disorder, unspecified: Secondary | ICD-10-CM | POA: Diagnosis not present

## 2018-01-29 ENCOUNTER — Encounter: Payer: Self-pay | Admitting: Internal Medicine

## 2018-03-11 DIAGNOSIS — L918 Other hypertrophic disorders of the skin: Secondary | ICD-10-CM | POA: Diagnosis not present

## 2018-03-11 DIAGNOSIS — Z85820 Personal history of malignant melanoma of skin: Secondary | ICD-10-CM | POA: Diagnosis not present

## 2018-03-11 DIAGNOSIS — Z1283 Encounter for screening for malignant neoplasm of skin: Secondary | ICD-10-CM | POA: Diagnosis not present

## 2018-03-11 DIAGNOSIS — X32XXXD Exposure to sunlight, subsequent encounter: Secondary | ICD-10-CM | POA: Diagnosis not present

## 2018-03-11 DIAGNOSIS — L820 Inflamed seborrheic keratosis: Secondary | ICD-10-CM | POA: Diagnosis not present

## 2018-03-11 DIAGNOSIS — Z08 Encounter for follow-up examination after completed treatment for malignant neoplasm: Secondary | ICD-10-CM | POA: Diagnosis not present

## 2018-03-11 DIAGNOSIS — L821 Other seborrheic keratosis: Secondary | ICD-10-CM | POA: Diagnosis not present

## 2018-03-11 DIAGNOSIS — L570 Actinic keratosis: Secondary | ICD-10-CM | POA: Diagnosis not present

## 2018-04-12 DIAGNOSIS — Z23 Encounter for immunization: Secondary | ICD-10-CM | POA: Diagnosis not present

## 2018-04-12 DIAGNOSIS — F419 Anxiety disorder, unspecified: Secondary | ICD-10-CM | POA: Diagnosis not present

## 2018-07-24 ENCOUNTER — Other Ambulatory Visit: Payer: Self-pay

## 2018-07-24 ENCOUNTER — Encounter (INDEPENDENT_AMBULATORY_CARE_PROVIDER_SITE_OTHER): Payer: Self-pay | Admitting: Internal Medicine

## 2018-07-24 ENCOUNTER — Ambulatory Visit (INDEPENDENT_AMBULATORY_CARE_PROVIDER_SITE_OTHER): Payer: BLUE CROSS/BLUE SHIELD | Admitting: Internal Medicine

## 2018-07-24 VITALS — BP 129/77 | HR 73 | Temp 98.0°F | Ht 66.0 in | Wt 180.9 lb

## 2018-07-24 DIAGNOSIS — K625 Hemorrhage of anus and rectum: Secondary | ICD-10-CM | POA: Diagnosis not present

## 2018-07-24 NOTE — Patient Instructions (Signed)
The risks of bleeding, perforation and infection were reviewed with patient.  

## 2018-07-24 NOTE — Progress Notes (Signed)
   Subjective:    Patient ID: James [name], male    DOB: 1955-11-17, 63 y.o.   MRN: [id number]  HPI Referred by Dr. Willey Blade for rectal bleeding/colonoscopy.If he has a loose BM and has to strain and he will bleed for a while. Sees on tissue. He sees a watery substance in his underwear at times. Monday, the blood splattered on the toilet after he strained. Symptoms started last years. Has been going on of and on.  Maternal grandmother had colon cancer in her 43s.   Last colonoscopy age 89 or 19.    Review of Systems Past Medical History:  Diagnosis Date  . Chest pain     Past Surgical History:  Procedure Laterality Date  . left shoulder surgery:bone spurs and bursitis tissue    . ROTATOR CUFF REPAIR     Left    Allergies  Allergen Reactions  . Codeine     Can take in limited amounts due to making hyper    Current Outpatient Medications on File Prior to Visit  Medication Sig Dispense Refill  . escitalopram (LEXAPRO) 10 MG tablet Take 15 mg by mouth daily.     Marland Kitchen LORazepam (ATIVAN) 0.5 MG tablet Take 0.125 tablets by mouth daily as needed.      No current facility-administered medications on file prior to visit.         Objective:   Physical Exam Blood pressure 129/77, pulse 73, temperature 98 F (36.7 C), height 5\' 6"  (1.676 m), weight 180 lb 14.4 oz (82.1 kg). Alert and oriented. Skin warm and dry. Oral mucosa is moist.   . Sclera anicteric, conjunctivae is pink. Thyroid not enlarged. No cervical lymphadenopathy. Lungs clear. Heart regular rate and rhythm.  Abdomen is soft. Bowel sounds are positive. No hepatomegaly. No abdominal masses felt. No tenderness.  No edema to lower extremities.          Assessment & Plan:  Rectal bleeding. Colonic neoplasm needs to be ruled out

## 2018-07-25 ENCOUNTER — Encounter (INDEPENDENT_AMBULATORY_CARE_PROVIDER_SITE_OTHER): Payer: Self-pay | Admitting: *Deleted

## 2018-07-25 ENCOUNTER — Other Ambulatory Visit (INDEPENDENT_AMBULATORY_CARE_PROVIDER_SITE_OTHER): Payer: Self-pay | Admitting: Internal Medicine

## 2018-07-25 DIAGNOSIS — K625 Hemorrhage of anus and rectum: Secondary | ICD-10-CM | POA: Insufficient documentation

## 2018-07-25 LAB — HEMOGLOBIN AND HEMATOCRIT, BLOOD
HCT: 46.7 % (ref 38.5–50.0)
Hemoglobin: 16.1 g/dL (ref 13.2–17.1)

## 2018-08-07 ENCOUNTER — Ambulatory Visit (HOSPITAL_COMMUNITY)
Admission: RE | Admit: 2018-08-07 | Discharge: 2018-08-07 | Disposition: A | Discharge status: Home / Self Care | Payer: BLUE CROSS/BLUE SHIELD | Attending: Internal Medicine | Admitting: Internal Medicine

## 2018-08-07 ENCOUNTER — Encounter (HOSPITAL_COMMUNITY)
Admission: RE | Disposition: A | Discharge status: Home / Self Care | Payer: Self-pay | Source: Home / Self Care | Attending: Internal Medicine

## 2018-08-07 ENCOUNTER — Other Ambulatory Visit: Payer: Self-pay

## 2018-08-07 DIAGNOSIS — Z885 Allergy status to narcotic agent status: Secondary | ICD-10-CM | POA: Insufficient documentation

## 2018-08-07 DIAGNOSIS — K648 Other hemorrhoids: Secondary | ICD-10-CM | POA: Diagnosis not present

## 2018-08-07 DIAGNOSIS — K5730 Diverticulosis of large intestine without perforation or abscess without bleeding: Secondary | ICD-10-CM | POA: Diagnosis not present

## 2018-08-07 DIAGNOSIS — Z79899 Other long term (current) drug therapy: Secondary | ICD-10-CM | POA: Insufficient documentation

## 2018-08-07 DIAGNOSIS — K644 Residual hemorrhoidal skin tags: Secondary | ICD-10-CM | POA: Diagnosis not present

## 2018-08-07 DIAGNOSIS — D122 Benign neoplasm of ascending colon: Secondary | ICD-10-CM | POA: Insufficient documentation

## 2018-08-07 DIAGNOSIS — Z800 Family history of malignant neoplasm of digestive organs: Secondary | ICD-10-CM | POA: Insufficient documentation

## 2018-08-07 DIAGNOSIS — K921 Melena: Secondary | ICD-10-CM | POA: Diagnosis not present

## 2018-08-07 DIAGNOSIS — Z87891 Personal history of nicotine dependence: Secondary | ICD-10-CM | POA: Diagnosis not present

## 2018-08-07 DIAGNOSIS — D125 Benign neoplasm of sigmoid colon: Secondary | ICD-10-CM | POA: Diagnosis not present

## 2018-08-07 DIAGNOSIS — K625 Hemorrhage of anus and rectum: Secondary | ICD-10-CM | POA: Insufficient documentation

## 2018-08-07 HISTORY — PX: COLONOSCOPY: SHX5424

## 2018-08-07 HISTORY — PX: POLYPECTOMY: SHX5525

## 2018-08-07 SURGERY — COLONOSCOPY
Anesthesia: Moderate Sedation

## 2018-08-07 MED ORDER — MEPERIDINE HCL 50 MG/ML IJ SOLN
INTRAMUSCULAR | Status: DC | PRN
  Administered 2018-08-07 (×2): 25 mg

## 2018-08-07 MED ORDER — MIDAZOLAM HCL 5 MG/5ML IJ SOLN
INTRAMUSCULAR | Status: AC
  Filled 2018-08-07: qty 10

## 2018-08-07 MED ORDER — MIDAZOLAM HCL 5 MG/5ML IJ SOLN
INTRAMUSCULAR | Status: DC | PRN
  Administered 2018-08-07: 2 mg via INTRAVENOUS
  Administered 2018-08-07: 1 mg via INTRAVENOUS
  Administered 2018-08-07 (×2): 2 mg via INTRAVENOUS

## 2018-08-07 MED ORDER — STERILE WATER FOR IRRIGATION IR SOLN
Status: DC | PRN
  Administered 2018-08-07: 2.5 mL

## 2018-08-07 MED ORDER — SODIUM CHLORIDE 0.9 % IV SOLN
INTRAVENOUS | Status: DC
  Administered 2018-08-07: 09:00:00 via INTRAVENOUS

## 2018-08-07 MED ORDER — MEPERIDINE HCL 50 MG/ML IJ SOLN
INTRAMUSCULAR | Status: AC
  Filled 2018-08-07: qty 1

## 2018-08-07 MED ORDER — BENEFIBER DRINK MIX PO PACK: 4.0000 g | PACK | Freq: Every day | ORAL | Status: DC

## 2018-08-07 NOTE — Discharge Instructions (Signed)
Diverticulosis  Diverticulosis is a condition that develops when small pouches (diverticula) form in the wall of the large intestine (colon). The colon is where water is absorbed and stool is formed. The pouches form when the inside layer of the colon pushes through weak spots in the outer layers of the colon. You may have a few pouches or many of them. What are the causes? The cause of this condition is not known. What increases the risk? The following factors may make you more likely to develop this condition:  Being older than age 85. Your risk for this condition increases with age. Diverticulosis is rare among people younger than age 3. By age 69, many people have it.  Eating a low-fiber diet.  Having frequent constipation.  Being overweight.  Not getting enough exercise.  Smoking.  Taking over-the-counter pain medicines, like aspirin and ibuprofen.  Having a family history of diverticulosis. What are the signs or symptoms? In most people, there are no symptoms of this condition. If you do have symptoms, they may include:  Bloating.  Cramps in the abdomen.  Constipation or diarrhea.  Pain in the lower left side of the abdomen. How is this diagnosed? This condition is most often diagnosed during an exam for other colon problems. Because diverticulosis usually has no symptoms, it often cannot be diagnosed independently. This condition may be diagnosed by:  Using a flexible scope to examine the colon (colonoscopy).  Taking an X-ray of the colon after dye has been put into the colon (barium enema).  Doing a CT scan. How is this treated? You may not need treatment for this condition if you have never developed an infection related to diverticulosis. If you have had an infection before, treatment may include:  Eating a high-fiber diet. This may include eating more fruits, vegetables, and grains.  Taking a fiber supplement.  Taking a live bacteria supplement  (probiotic).  Taking medicine to relax your colon.  Taking antibiotic medicines. Follow these instructions at home:  Drink 6-8 glasses of water or more each day to prevent constipation.  Try not to strain when you have a bowel movement.  If you have had an infection before: ? Eat more fiber as directed by your health care provider or your diet and nutrition specialist (dietitian). ? Take a fiber supplement or probiotic, if your health care provider approves.  Take over-the-counter and prescription medicines only as told by your health care provider.  If you were prescribed an antibiotic, take it as told by your health care provider. Do not stop taking the antibiotic even if you start to feel better.  Keep all follow-up visits as told by your health care provider. This is important. Contact a health care provider if:  You have pain in your abdomen.  You have bloating.  You have cramps.  You have not had a bowel movement in 3 days. Get help right away if:  Your pain gets worse.  Your bloating becomes very bad.  You have a fever or chills, and your symptoms suddenly get worse.  You vomit.  You have bowel movements that are bloody or black.  You have bleeding from your rectum. Summary  Diverticulosis is a condition that develops when small pouches (diverticula) form in the wall of the large intestine (colon).  You may have a few pouches or many of them.  This condition is most often diagnosed during an exam for other colon problems.  If you have had an infection related to  diverticulosis, treatment may include increasing the fiber in your diet, taking supplements, or taking medicines. This information is not intended to replace advice given to you by your health care provider. Make sure you discuss any questions you have with your health care provider. Document Released: 12/16/2003 Document Revised: 02/07/2016 Document Reviewed: 02/07/2016 Elsevier Interactive Patient  Education  2019 [employer]. Colonoscopy, Adult, Care After This sheet gives you information about how to care for yourself after your procedure. Your health care provider may also give you more specific instructions. If you have problems or questions, contact your health care provider. What can I expect after the procedure? After the procedure, it is common to have:  A small amount of blood in your stool for 24 hours after the procedure.  Some gas.  Mild abdominal cramping or bloating. Follow these instructions at home: General instructions  For the first 24 hours after the procedure: ? Do not drive or use machinery. ? Do not sign important documents. ? Do not drink alcohol. ? Do your regular daily activities at a slower pace than normal. ? Eat soft, easy-to-digest foods.  Take over-the-counter or prescription medicines only as told by your health care provider. Relieving cramping and bloating   Try walking around when you have cramps or feel bloated.  Apply heat to your abdomen as told by your health care provider. Use a heat source that your health care provider recommends, such as a moist heat pack or a heating pad. ? Place a towel between your skin and the heat source. ? Leave the heat on for 20-30 minutes. ? Remove the heat if your skin turns bright red. This is especially important if you are unable to feel pain, heat, or cold. You may have a greater risk of getting burned. Eating and drinking   Drink enough fluid to keep your urine pale yellow.  Resume your normal diet as instructed by your health care provider. Avoid heavy or fried foods that are hard to digest.  Avoid drinking alcohol for as long as instructed by your health care provider. Contact a health care provider if:  You have blood in your stool 2-3 days after the procedure. Get help right away if:  You have more than a small spotting of blood in your stool.  You pass large blood clots in your  stool.  Your abdomen is swollen.  You have nausea or vomiting.  You have a fever.  You have increasing abdominal pain that is not relieved with medicine. Summary  After the procedure, it is common to have a small amount of blood in your stool. You may also have mild abdominal cramping and bloating.  For the first 24 hours after the procedure, do not drive or use machinery, sign important documents, or drink alcohol.  Contact your health care provider if you have a lot of blood in your stool, nausea or vomiting, a fever, or increased abdominal pain. This information is not intended to replace advice given to you by your health care provider. Make sure you discuss any questions you have with your health care provider. Document Released: 11/02/2003 Document Revised: 01/10/2017 Document Reviewed: 06/01/2015 Elsevier Interactive Patient Education  2019 [employer] Colon Polyps  Polyps are tissue growths inside the body. Polyps can grow in many places, including the large intestine (colon). A polyp may be a round bump or a mushroom-shaped growth. You could have one polyp or several. Most colon polyps are noncancerous (benign). However, some colon polyps can  become cancerous over time. Finding and removing the polyps early can help prevent this. What are the causes? The exact cause of colon polyps is not known. What increases the risk? You are more likely to develop this condition if you:  Have a family history of colon cancer or colon polyps.  Are older than 2 or older than 45 if you are African American.  Have inflammatory bowel disease, such as ulcerative colitis or Crohn's disease.  Have certain hereditary conditions, such as: ? Familial adenomatous polyposis. ? Lynch syndrome. ? Turcot syndrome. ? Peutz-Jeghers syndrome.  Are overweight.  Smoke cigarettes.  Do not get enough exercise.  Drink too much alcohol.  Eat a diet that is high in fat and red meat and low in  fiber.  Had childhood cancer that was treated with abdominal radiation. What are the signs or symptoms? Most polyps do not cause symptoms. If you have symptoms, they may include:  Blood coming from your rectum when having a bowel movement.  Blood in your stool. The stool may look dark red or black.  Abdominal pain.  A change in bowel habits, such as constipation or diarrhea. How is this diagnosed? This condition is diagnosed with a colonoscopy. This is a procedure in which a lighted, flexible scope is inserted into the anus and then passed into the colon to examine the area. Polyps are sometimes found when a colonoscopy is done as part of routine cancer screening tests. How is this treated? Treatment for this condition involves removing any polyps that are found. Most polyps can be removed during a colonoscopy. Those polyps will then be tested for cancer. Additional treatment may be needed depending on the results of testing. Follow these instructions at home: Lifestyle  Maintain a healthy weight, or lose weight if recommended by your health care provider.  Exercise every day or as told by your health care provider.  Do not use any products that contain nicotine or tobacco, such as cigarettes and e-cigarettes. If you need help quitting, ask your health care provider.  If you drink alcohol, limit how much you have: ? 0-1 drink a day for women. ? 0-2 drinks a day for men.  Be aware of how much alcohol is in your drink. In the [place] one drink equals one 12 oz bottle of beer (355 mL), one 5 oz glass of wine (148 mL), or one 1 oz shot of hard liquor (44 mL). Eating and drinking   Eat foods that are high in fiber, such as fruits, vegetables, and whole grains.  Eat foods that are high in calcium and vitamin D, such as milk, cheese, yogurt, eggs, liver, fish, and broccoli.  Limit foods that are high in fat, such as fried foods and desserts.  Limit the amount of red meat and  processed meat you eat, such as hot dogs, sausage, bacon, and lunch meats. General instructions  Keep all follow-up visits as told by your health care provider. This is important. ? This includes having regularly scheduled colonoscopies. ? Talk to your health care provider about when you need a colonoscopy. Contact a health care provider if:  You have new or worsening bleeding during a bowel movement.  You have new or increased blood in your stool.  You have a change in bowel habits.  You lose weight for no known reason. Summary  Polyps are tissue growths inside the body. Polyps can grow in many places, including the colon.  Most colon polyps are noncancerous (benign),  but some can become cancerous over time.  This condition is diagnosed with a colonoscopy.  Treatment for this condition involves removing any polyps that are found. Most polyps can be removed during a colonoscopy. This information is not intended to replace advice given to you by your health care provider. Make sure you discuss any questions you have with your health care provider. Document Released: 12/15/2003 Document Revised: 07/05/2017 Document Reviewed: 07/05/2017 Elsevier Interactive Patient Education  2019 [employer]. No aspirin or NSAIDs for 24 hours  Resume usual medications as before. Benefiber or equivalent 4 g by mouth daily at bedtime. High-fiber diet. No driving for 24 hours. Physician will call with biopsy results and further recommendations.

## 2018-08-07 NOTE — Op Note (Signed)
[facility] Patient Name: James [name] Procedure Date: 08/07/2018 8:50 AM MRN: [id number] Date of Birth: [birth year] Attending MD: Hildred Laser , MD CSN: [id number] Age: 63 Admit Type: Outpatient Procedure:                Colonoscopy Indications:              Hematochezia Providers:                Hildred Laser, MD, Gwynneth Albright RN, RN,                            Raphael Gibney, Technician, Randa Spike,                            Technician Referring MD:             Asencion Noble, MD Medicines:                Meperidine 50 mg IV, Midazolam 7 mg IV Complications:            No immediate complications. Estimated Blood Loss:     Estimated blood loss was minimal. Procedure:                Pre-Anesthesia Assessment:                           - Prior to the procedure, a History and Physical                            was performed, and patient medications and                            allergies were reviewed. The patient's tolerance of                            previous anesthesia was also reviewed. The risks                            and benefits of the procedure and the sedation                            options and risks were discussed with the patient.                            All questions were answered, and informed consent                            was obtained. Prior Anticoagulants: The patient has                            taken no previous anticoagulant or antiplatelet                            agents. ASA Grade Assessment: II - A patient with  mild systemic disease. After reviewing the risks                            and benefits, the patient was deemed in                            satisfactory condition to undergo the procedure.                           After obtaining informed consent, the colonoscope                            was passed under direct vision. Throughout the                            procedure, the patient's  blood pressure, pulse, and                            oxygen saturations were monitored continuously. The                            PCF-[id number] ([id number]) was introduced through the                            anus and advanced to the the cecum, identified by                            appendiceal orifice and ileocecal valve. The                            colonoscopy was performed without difficulty. The                            patient tolerated the procedure well. The quality                            of the bowel preparation was excellent. The                            ileocecal valve, appendiceal orifice, and rectum                            were photographed. Scope In: 9:25:53 AM Scope Out: 9:44:43 AM Scope Withdrawal Time: 0 hours 13 minutes 28 seconds  Total Procedure Duration: 0 hours 18 minutes 50 seconds  Findings:      The perianal and digital rectal examinations were normal.      A 4 mm polyp was found in the mid ascending colon. The polyp was       sessile. The polyp was removed with a cold snare. Resection and       retrieval were complete.      A few small-mouthed diverticula were found in the sigmoid colon.      External and internal hemorrhoids were found during retroflexion. The       hemorrhoids were medium-sized. Impression:               -  One 4 mm polyp in the mid ascending colon,                            removed with a cold snare. Resected and retrieved.                           - Diverticulosis in the sigmoid colon.                           - External and internal hemorrhoids. Moderate Sedation:      Moderate (conscious) sedation was administered by the endoscopy nurse       and supervised by the endoscopist. The following parameters were       monitored: oxygen saturation, heart rate, blood pressure, CO2       capnography and response to care. Total physician intraservice time was       23 minutes. Recommendation:           - Patient has a contact  number available for                            emergencies. The signs and symptoms of potential                            delayed complications were discussed with the                            patient. Return to normal activities tomorrow.                            Written discharge instructions were provided to the                            patient.                           - High fiber diet today.                           - Continue present medications.                           Benefiber 4 g po qhs.                           - No aspirin, ibuprofen, naproxen, or other                            non-steroidal anti-inflammatory drugs for 1 day.                           - Await pathology results.                           - Repeat colonoscopy is recommended. The  colonoscopy date will be determined after pathology                            results from today's exam become available for                            review. Procedure Code(s):        --- Professional ---                           [phone], Colonoscopy, flexible; with removal of                            tumor(s), polyp(s), or other lesion(s) by snare                            technique                           99153, Moderate sedation; each additional 15                            minutes intraservice time                           G0500, Moderate sedation services provided by the                            same physician or other qualified health care                            professional performing a gastrointestinal                            endoscopic service that sedation supports,                            requiring the presence of an independent trained                            observer to assist in the monitoring of the                            patient's level of consciousness and physiological                            status; initial 15 minutes of intra-service time;                             patient age 80 years or older (additional time may                            be reported with [phone], as appropriate) Diagnosis Code(s):        --- Professional ---  K63.5, Polyp of colon                           K64.8, Other hemorrhoids                           K92.1, Melena (includes Hematochezia)                           K57.30, Diverticulosis of large intestine without                            perforation or abscess without bleeding CPT copyright 2019 [employer]. All rights reserved. The codes documented in this report are preliminary and upon coder review may  be revised to meet current compliance requirements. Hildred Laser, MD Hildred Laser, MD 08/07/2018 9:54:40 AM This report has been signed electronically. Number of Addenda: 0

## 2018-08-07 NOTE — H&P (Signed)
James [name] is an 63 y.o. male.   Chief Complaint: Patient is here for colonoscopy. HPI: Patient is 63 year old Caucasian male who presents with 1 year history of intermittent rectal bleeding which always occurs with his bowel movements and particularly when stool is loose.  It is fresh blood and small amount.  He denies abdominal pain or weight loss.  Last screening colonoscopy was normal about 10 years ago.  Recent hemoglobin was 16.1 g.  He does not take aspirin and may take Advil occasionally for headache. Family history significant for maternal grandmother having colon carcinoma in her 7s and died within 2 to 3 years of metastatic disease.  Past Medical History:  Diagnosis Date  . Chest pain     Past Surgical History:  Procedure Laterality Date  . left shoulder surgery:bone spurs and bursitis tissue    . ROTATOR CUFF REPAIR     Left    No family history on file. Social History:  reports that he has quit smoking. He has never used smokeless tobacco. He reports that he does not drink alcohol or use drugs.  Allergies:  Allergies  Allergen Reactions  . Codeine     Can take in limited amounts due to making hyper    Medications Prior to Admission  Medication Sig Dispense Refill  . diphenhydrAMINE (EQ NIGHTTIME SLEEP AID) 25 MG tablet Take 25 mg by mouth at bedtime. EQUATE SLEEP AID    . escitalopram (LEXAPRO) 10 MG tablet Take 15 mg by mouth daily.     Marland Kitchen LORazepam (ATIVAN) 0.5 MG tablet Take 0.125 tablets by mouth at bedtime as needed for anxiety or sleep.       No results found for this or any previous visit (from the past 48 hour(s)). No results found.  ROS  Blood pressure (!) 143/85, pulse 80, temperature 98.3 F (36.8 C), temperature source Oral, resp. rate 13, height 5\' 6"  (1.676 m), weight 82.1 kg, SpO2 99 %. Physical Exam  Constitutional: He appears well-developed and well-nourished.  HENT:  Mouth/Throat: Oropharynx is clear and moist.  Eyes: Conjunctivae are  normal. No scleral icterus.  Neck: No thyromegaly present.  Cardiovascular: Normal rate, regular rhythm and normal heart sounds.  No murmur heard. Respiratory: Effort normal and breath sounds normal.  GI: Soft. He exhibits no distension and no mass. There is no abdominal tenderness.  Musculoskeletal:        General: No edema.  Lymphadenopathy:    He has no cervical adenopathy.  Neurological: He is alert.  Skin: Skin is warm and dry.     Assessment/Plan Hematochezia. Diagnostic colonoscopy.  Hildred Laser, MD 08/07/2018, 9:14 AM

## 2018-08-08 ENCOUNTER — Encounter (HOSPITAL_COMMUNITY): Payer: Self-pay | Admitting: Internal Medicine

## 2018-08-14 ENCOUNTER — Telehealth (INDEPENDENT_AMBULATORY_CARE_PROVIDER_SITE_OTHER): Payer: Self-pay | Admitting: *Deleted

## 2018-08-14 NOTE — Telephone Encounter (Signed)
Patient was called and given Dr.Rehman's recommendations , he will call Dr.Fagan back for a follow up.

## 2018-08-14 NOTE — Telephone Encounter (Signed)
Dr.Rehman made aware. 

## 2018-08-14 NOTE — Telephone Encounter (Signed)
Patient called but no answer. Patient had colonoscopy 1 week ago and received Demerol and Versed. Patient symptoms are not related to the procedure or the medication given that there is been 1 week interval. I recommend patient be seen by Dr. Willey Blade for the symptoms.

## 2018-08-14 NOTE — Telephone Encounter (Signed)
Dr. Laural Golden has been sent a message.

## 2018-08-14 NOTE — Telephone Encounter (Signed)
Patient called and states that he had a procedure last Wednesday. He woke up this morning with a sore throat and right side under tongue swollen. He called PCP , Dr.Fagan and he ask patient to contact Dr.Rehman to see if patient may have been given something at the time of procedure that may be causing this.  Dr. Laural Golden called waiting for return call.

## 2018-09-23 DIAGNOSIS — L570 Actinic keratosis: Secondary | ICD-10-CM | POA: Diagnosis not present

## 2018-09-23 DIAGNOSIS — X32XXXD Exposure to sunlight, subsequent encounter: Secondary | ICD-10-CM | POA: Diagnosis not present

## 2018-09-23 DIAGNOSIS — Z08 Encounter for follow-up examination after completed treatment for malignant neoplasm: Secondary | ICD-10-CM | POA: Diagnosis not present

## 2018-09-23 DIAGNOSIS — Z1283 Encounter for screening for malignant neoplasm of skin: Secondary | ICD-10-CM | POA: Diagnosis not present

## 2018-09-23 DIAGNOSIS — D225 Melanocytic nevi of trunk: Secondary | ICD-10-CM | POA: Diagnosis not present

## 2018-09-23 DIAGNOSIS — Z85820 Personal history of malignant melanoma of skin: Secondary | ICD-10-CM | POA: Diagnosis not present

## 2018-11-18 DIAGNOSIS — R7301 Impaired fasting glucose: Secondary | ICD-10-CM | POA: Diagnosis not present

## 2018-11-18 DIAGNOSIS — E291 Testicular hypofunction: Secondary | ICD-10-CM | POA: Diagnosis not present

## 2018-11-18 DIAGNOSIS — Z125 Encounter for screening for malignant neoplasm of prostate: Secondary | ICD-10-CM | POA: Diagnosis not present

## 2018-11-18 DIAGNOSIS — E785 Hyperlipidemia, unspecified: Secondary | ICD-10-CM | POA: Diagnosis not present

## 2018-11-18 DIAGNOSIS — F419 Anxiety disorder, unspecified: Secondary | ICD-10-CM | POA: Diagnosis not present

## 2018-12-03 DIAGNOSIS — E291 Testicular hypofunction: Secondary | ICD-10-CM | POA: Diagnosis not present

## 2018-12-03 DIAGNOSIS — E785 Hyperlipidemia, unspecified: Secondary | ICD-10-CM | POA: Diagnosis not present

## 2018-12-03 DIAGNOSIS — F419 Anxiety disorder, unspecified: Secondary | ICD-10-CM | POA: Diagnosis not present

## 2019-01-09 ENCOUNTER — Other Ambulatory Visit: Payer: Self-pay

## 2019-01-09 DIAGNOSIS — Z20828 Contact with and (suspected) exposure to other viral communicable diseases: Secondary | ICD-10-CM | POA: Diagnosis not present

## 2019-01-09 DIAGNOSIS — Z20822 Contact with and (suspected) exposure to covid-19: Secondary | ICD-10-CM

## 2019-01-11 LAB — NOVEL CORONAVIRUS, NAA: SARS-CoV-2, NAA: NOT DETECTED

## 2019-01-14 ENCOUNTER — Other Ambulatory Visit: Payer: Self-pay

## 2019-01-14 DIAGNOSIS — Z20822 Contact with and (suspected) exposure to covid-19: Secondary | ICD-10-CM

## 2019-01-16 LAB — NOVEL CORONAVIRUS, NAA: SARS-CoV-2, NAA: DETECTED — AB

## 2019-01-23 ENCOUNTER — Other Ambulatory Visit: Payer: Self-pay

## 2019-01-23 DIAGNOSIS — Z20822 Contact with and (suspected) exposure to covid-19: Secondary | ICD-10-CM

## 2019-01-29 LAB — NOVEL CORONAVIRUS, NAA: SARS-CoV-2, NAA: DETECTED — AB

## 2019-01-30 DIAGNOSIS — U071 COVID-19: Secondary | ICD-10-CM | POA: Diagnosis not present

## 2019-01-31 DIAGNOSIS — Z23 Encounter for immunization: Secondary | ICD-10-CM | POA: Diagnosis not present

## 2019-03-31 DIAGNOSIS — D225 Melanocytic nevi of trunk: Secondary | ICD-10-CM | POA: Diagnosis not present

## 2019-03-31 DIAGNOSIS — Z08 Encounter for follow-up examination after completed treatment for malignant neoplasm: Secondary | ICD-10-CM | POA: Diagnosis not present

## 2019-03-31 DIAGNOSIS — B078 Other viral warts: Secondary | ICD-10-CM | POA: Diagnosis not present

## 2019-03-31 DIAGNOSIS — Z1283 Encounter for screening for malignant neoplasm of skin: Secondary | ICD-10-CM | POA: Diagnosis not present

## 2019-03-31 DIAGNOSIS — Z85820 Personal history of malignant melanoma of skin: Secondary | ICD-10-CM | POA: Diagnosis not present

## 2019-03-31 DIAGNOSIS — X32XXXD Exposure to sunlight, subsequent encounter: Secondary | ICD-10-CM | POA: Diagnosis not present

## 2019-03-31 DIAGNOSIS — L570 Actinic keratosis: Secondary | ICD-10-CM | POA: Diagnosis not present

## 2019-04-14 DIAGNOSIS — Z1159 Encounter for screening for other viral diseases: Secondary | ICD-10-CM | POA: Diagnosis not present

## 2019-05-13 DIAGNOSIS — Z20822 Contact with and (suspected) exposure to covid-19: Secondary | ICD-10-CM | POA: Diagnosis not present

## 2019-05-13 DIAGNOSIS — F411 Generalized anxiety disorder: Secondary | ICD-10-CM | POA: Diagnosis not present

## 2019-06-08 DIAGNOSIS — Z23 Encounter for immunization: Secondary | ICD-10-CM | POA: Diagnosis not present

## 2019-06-29 DIAGNOSIS — Z23 Encounter for immunization: Secondary | ICD-10-CM | POA: Diagnosis not present

## 2019-09-11 DIAGNOSIS — Z6830 Body mass index (BMI) 30.0-30.9, adult: Secondary | ICD-10-CM | POA: Diagnosis not present

## 2019-09-11 DIAGNOSIS — F419 Anxiety disorder, unspecified: Secondary | ICD-10-CM | POA: Diagnosis not present

## 2019-09-29 DIAGNOSIS — X32XXXD Exposure to sunlight, subsequent encounter: Secondary | ICD-10-CM | POA: Diagnosis not present

## 2019-09-29 DIAGNOSIS — Z08 Encounter for follow-up examination after completed treatment for malignant neoplasm: Secondary | ICD-10-CM | POA: Diagnosis not present

## 2019-09-29 DIAGNOSIS — Z1283 Encounter for screening for malignant neoplasm of skin: Secondary | ICD-10-CM | POA: Diagnosis not present

## 2019-09-29 DIAGNOSIS — D225 Melanocytic nevi of trunk: Secondary | ICD-10-CM | POA: Diagnosis not present

## 2019-09-29 DIAGNOSIS — L905 Scar conditions and fibrosis of skin: Secondary | ICD-10-CM | POA: Diagnosis not present

## 2019-09-29 DIAGNOSIS — L570 Actinic keratosis: Secondary | ICD-10-CM | POA: Diagnosis not present

## 2019-09-29 DIAGNOSIS — D485 Neoplasm of uncertain behavior of skin: Secondary | ICD-10-CM | POA: Diagnosis not present

## 2019-09-29 DIAGNOSIS — Z85820 Personal history of malignant melanoma of skin: Secondary | ICD-10-CM | POA: Diagnosis not present

## 2019-09-29 DIAGNOSIS — L821 Other seborrheic keratosis: Secondary | ICD-10-CM | POA: Diagnosis not present

## 2019-10-13 DIAGNOSIS — D485 Neoplasm of uncertain behavior of skin: Secondary | ICD-10-CM | POA: Diagnosis not present

## 2019-10-13 DIAGNOSIS — L988 Other specified disorders of the skin and subcutaneous tissue: Secondary | ICD-10-CM | POA: Diagnosis not present

## 2019-12-11 DIAGNOSIS — E785 Hyperlipidemia, unspecified: Secondary | ICD-10-CM | POA: Diagnosis not present

## 2019-12-11 DIAGNOSIS — E291 Testicular hypofunction: Secondary | ICD-10-CM | POA: Diagnosis not present

## 2019-12-11 DIAGNOSIS — F419 Anxiety disorder, unspecified: Secondary | ICD-10-CM | POA: Diagnosis not present

## 2019-12-11 DIAGNOSIS — Z125 Encounter for screening for malignant neoplasm of prostate: Secondary | ICD-10-CM | POA: Diagnosis not present

## 2019-12-11 DIAGNOSIS — Z79899 Other long term (current) drug therapy: Secondary | ICD-10-CM | POA: Diagnosis not present

## 2019-12-18 DIAGNOSIS — E785 Hyperlipidemia, unspecified: Secondary | ICD-10-CM | POA: Diagnosis not present

## 2019-12-18 DIAGNOSIS — F419 Anxiety disorder, unspecified: Secondary | ICD-10-CM | POA: Diagnosis not present

## 2019-12-18 DIAGNOSIS — E291 Testicular hypofunction: Secondary | ICD-10-CM | POA: Diagnosis not present

## 2020-01-07 ENCOUNTER — Other Ambulatory Visit: Payer: Self-pay | Admitting: Internal Medicine

## 2020-01-07 ENCOUNTER — Other Ambulatory Visit (HOSPITAL_COMMUNITY): Payer: Self-pay | Admitting: Internal Medicine

## 2020-01-07 DIAGNOSIS — E785 Hyperlipidemia, unspecified: Secondary | ICD-10-CM

## 2020-02-03 ENCOUNTER — Other Ambulatory Visit: Payer: Self-pay

## 2020-02-03 ENCOUNTER — Ambulatory Visit (HOSPITAL_COMMUNITY)
Admission: RE | Admit: 2020-02-03 | Discharge: 2020-02-03 | Disposition: A | Discharge status: Home / Self Care | Payer: Self-pay | Source: Ambulatory Visit | Attending: Internal Medicine | Admitting: Internal Medicine

## 2020-02-03 DIAGNOSIS — E785 Hyperlipidemia, unspecified: Secondary | ICD-10-CM

## 2020-02-16 ENCOUNTER — Ambulatory Visit
Admission: EM | Admit: 2020-02-16 | Discharge: 2020-02-16 | Disposition: A | Discharge status: Home / Self Care | Payer: Commercial Managed Care - PPO | Attending: Emergency Medicine | Admitting: Emergency Medicine

## 2020-02-16 ENCOUNTER — Other Ambulatory Visit: Payer: Self-pay

## 2020-02-16 DIAGNOSIS — M545 Low back pain, unspecified: Secondary | ICD-10-CM

## 2020-02-16 DIAGNOSIS — Z1152 Encounter for screening for COVID-19: Secondary | ICD-10-CM

## 2020-02-16 MED ORDER — DEXAMETHASONE 4 MG PO TABS: 4.0000 mg | ORAL_TABLET | Freq: Every day | ORAL | 0 refills | Status: AC

## 2020-02-16 NOTE — Discharge Instructions (Signed)
COVID testing ordered.  It will take between 2-7 days for test results.  Someone will contact you regarding abnormal results.    In the meantime: You should remain isolated in your home for 10 days from symptom onset AND greater than 24 hours after symptoms resolution (absence of fever without the use of fever-reducing medication and improvement in respiratory symptoms), whichever is longer Get plenty of rest and push fluids Decadron was prescribed Use medications daily for symptom relief Use OTC medications like ibuprofen or tylenol as needed fever or pain Call or go to the ED if you have any new or worsening symptoms such as fever, worsening cough, shortness of breath, chest tightness, chest pain, turning blue, changes in mental status, etc..Marland Kitchen

## 2020-02-16 NOTE — ED Triage Notes (Signed)
Pt presents with c/o low back pain, wants covid test as well, denies injury

## 2020-02-16 NOTE — ED Provider Notes (Signed)
James [name]   [id number] 02/16/20 Arrival Time: 52   Chief Complaint  Patient presents with  . Back Pain     SUBJECTIVE: History from: patient and family.  James [name] is a 64 y.o. male who presented to the urgent care for complaint of lower back pain for the past few days.  Denies any precipitating event, trauma or injury or sick exposure to [place], flu.    Has tried OTC medication without relief.  Denies alleviating or aggravating factors.  Reports similar symptoms in the past and was diagnosed with Covid 19.   Denies fever, chills, fatigue, sinus pain, rhinorrhea, sore throat, SOB, wheezing, chest pain, nausea, changes in bowel or bladder habits.     ROS: As per HPI.  All other pertinent ROS negative.     Past Medical History:  Diagnosis Date  . Chest pain    Past Surgical History:  Procedure Laterality Date  . COLONOSCOPY N/A 08/07/2018   Procedure: COLONOSCOPY;  Surgeon: Rogene Houston, MD;  Location: [facility];  Service: Endoscopy;  Laterality: N/A;  915  . left shoulder surgery:bone spurs and bursitis tissue    . POLYPECTOMY  08/07/2018   Procedure: POLYPECTOMY;  Surgeon: Rogene Houston, MD;  Location: [facility];  Service: Endoscopy;;  ascending colon polyp   . ROTATOR CUFF REPAIR     Left   Allergies  Allergen Reactions  . Codeine     Can take in limited amounts due to making hyper   No current facility-administered medications on file prior to encounter.   Current Outpatient Medications on File Prior to Encounter  Medication Sig Dispense Refill  . diphenhydrAMINE (EQ NIGHTTIME SLEEP AID) 25 MG tablet Take 25 mg by mouth at bedtime. EQUATE SLEEP AID    . escitalopram (LEXAPRO) 10 MG tablet Take 15 mg by mouth daily.     Marland Kitchen LORazepam (ATIVAN) 0.5 MG tablet Take 0.125 tablets by mouth at bedtime as needed for anxiety or sleep.     . Wheat Dextrin (BENEFIBER DRINK MIX) PACK Take 4 g by mouth at bedtime.     Social History   Socioeconomic  History  . Marital status: Married    Spouse name: Not on file  . Number of children: Not on file  . Years of education: Not on file  . Highest education level: Not on file  Occupational History  . Occupation: Full time  Tobacco Use  . Smoking status: Former Research scientist (life sciences)  . Smokeless tobacco: Never Used  Substance and Sexual Activity  . Alcohol use: No  . Drug use: No  . Sexual activity: Not on file  Other Topics Concern  . Not on file  Social History Narrative   Single   Gets regular exercise   Social Determinants of Health   Financial Resource Strain:   . Difficulty of Paying Living Expenses: Not on file  Food Insecurity:   . Worried About Charity fundraiser in the Last Year: Not on file  . Ran Out of Food in the Last Year: Not on file  Transportation Needs:   . Lack of Transportation (Medical): Not on file  . Lack of Transportation (Non-Medical): Not on file  Physical Activity:   . Days of Exercise per Week: Not on file  . Minutes of Exercise per Session: Not on file  Stress:   . Feeling of Stress : Not on file  Social Connections:   . Frequency of Communication with Friends and Family:  Not on file  . Frequency of Social Gatherings with Friends and Family: Not on file  . Attends Religious Services: Not on file  . Active Member of Clubs or Organizations: Not on file  . Attends Archivist Meetings: Not on file  . Marital Status: Not on file  Intimate Partner Violence:   . Fear of Current or Ex-Partner: Not on file  . Emotionally Abused: Not on file  . Physically Abused: Not on file  . Sexually Abused: Not on file   History reviewed. No pertinent family history.  OBJECTIVE:  Vitals:   02/16/20 1432  BP: 137/86  Pulse: 76  Resp: 16  Temp: 98 F (36.7 C)  SpO2: 97%     General appearance: alert; appears fatigued, but nontoxic; speaking in full sentences and tolerating own secretions HEENT: NCAT; Ears: EACs clear, TMs pearly gray; Eyes: PERRL.  EOM  grossly intact. Sinuses: nontender; Nose: nares patent without rhinorrhea, Throat: oropharynx clear, tonsils non erythematous or enlarged, uvula midline  Neck: supple without LAD Lungs: unlabored respirations, symmetrical air entry; cough: absent; no respiratory distress; CTAB Heart: regular rate and rhythm.  Radial pulses 2+ symmetrical bilaterally Skin: warm and dry MUSC: No tenderness on palpation Psychological: alert and cooperative; normal mood and affect  LABS:  No results found for this or any previous visit (from the past 24 hour(s)).   ASSESSMENT & PLAN:  1. Encounter for screening for COVID-19   2. Episodic low back pain     Meds ordered this encounter  Medications  . dexamethasone (DECADRON) 4 MG tablet    Sig: Take 1 tablet (4 mg total) by mouth daily for 7 days.    Dispense:  7 tablet    Refill:  0    Discharge instructions  COVID testing ordered.  It will take between 2-7 days for test results.  Someone will contact you regarding abnormal results.    In the meantime: You should remain isolated in your home for 10 days from symptom onset AND greater than 24 hours after symptoms resolution (absence of fever without the use of fever-reducing medication and improvement in respiratory symptoms), whichever is longer Get plenty of rest and push fluids Decadron was prescribed Use medications daily for symptom relief Use OTC medications like ibuprofen or tylenol as needed fever or pain Call or go to the ED if you have any new or worsening symptoms such as fever, worsening cough, shortness of breath, chest tightness, chest pain, turning blue, changes in mental status, etc...   Reviewed expectations re: course of current medical issues. Questions answered. Outlined signs and symptoms indicating need for more acute intervention. Patient verbalized understanding. After Visit Summary given.         Emerson Monte, FNP 02/16/20 1510

## 2020-02-17 LAB — NOVEL CORONAVIRUS, NAA: SARS-CoV-2, NAA: NOT DETECTED

## 2020-02-17 LAB — SARS-COV-2, NAA 2 DAY TAT

## 2020-03-15 DIAGNOSIS — Z23 Encounter for immunization: Secondary | ICD-10-CM | POA: Diagnosis not present

## 2020-04-12 DIAGNOSIS — Z08 Encounter for follow-up examination after completed treatment for malignant neoplasm: Secondary | ICD-10-CM | POA: Diagnosis not present

## 2020-04-12 DIAGNOSIS — D485 Neoplasm of uncertain behavior of skin: Secondary | ICD-10-CM | POA: Diagnosis not present

## 2020-04-12 DIAGNOSIS — C44311 Basal cell carcinoma of skin of nose: Secondary | ICD-10-CM | POA: Diagnosis not present

## 2020-04-12 DIAGNOSIS — Z1283 Encounter for screening for malignant neoplasm of skin: Secondary | ICD-10-CM | POA: Diagnosis not present

## 2020-04-12 DIAGNOSIS — D225 Melanocytic nevi of trunk: Secondary | ICD-10-CM | POA: Diagnosis not present

## 2020-04-12 DIAGNOSIS — D487 Neoplasm of uncertain behavior of other specified sites: Secondary | ICD-10-CM | POA: Diagnosis not present

## 2020-04-12 DIAGNOSIS — Z85820 Personal history of malignant melanoma of skin: Secondary | ICD-10-CM | POA: Diagnosis not present

## 2020-04-19 DIAGNOSIS — F419 Anxiety disorder, unspecified: Secondary | ICD-10-CM | POA: Diagnosis not present

## 2020-04-19 DIAGNOSIS — E291 Testicular hypofunction: Secondary | ICD-10-CM | POA: Diagnosis not present

## 2020-05-17 DIAGNOSIS — Z08 Encounter for follow-up examination after completed treatment for malignant neoplasm: Secondary | ICD-10-CM | POA: Diagnosis not present

## 2020-05-17 DIAGNOSIS — Z85828 Personal history of other malignant neoplasm of skin: Secondary | ICD-10-CM | POA: Diagnosis not present

## 2020-07-01 DIAGNOSIS — F419 Anxiety disorder, unspecified: Secondary | ICD-10-CM | POA: Diagnosis not present

## 2020-07-01 DIAGNOSIS — E291 Testicular hypofunction: Secondary | ICD-10-CM | POA: Diagnosis not present

## 2020-07-01 DIAGNOSIS — Z125 Encounter for screening for malignant neoplasm of prostate: Secondary | ICD-10-CM | POA: Diagnosis not present

## 2020-07-01 DIAGNOSIS — Z79899 Other long term (current) drug therapy: Secondary | ICD-10-CM | POA: Diagnosis not present

## 2020-07-01 DIAGNOSIS — E785 Hyperlipidemia, unspecified: Secondary | ICD-10-CM | POA: Diagnosis not present

## 2020-07-08 DIAGNOSIS — F411 Generalized anxiety disorder: Secondary | ICD-10-CM | POA: Diagnosis not present

## 2020-07-08 DIAGNOSIS — E785 Hyperlipidemia, unspecified: Secondary | ICD-10-CM | POA: Diagnosis not present

## 2020-07-08 DIAGNOSIS — Z0000 Encounter for general adult medical examination without abnormal findings: Secondary | ICD-10-CM | POA: Diagnosis not present

## 2020-07-08 DIAGNOSIS — E291 Testicular hypofunction: Secondary | ICD-10-CM | POA: Diagnosis not present

## 2020-07-08 DIAGNOSIS — S81802A Unspecified open wound, left lower leg, initial encounter: Secondary | ICD-10-CM | POA: Diagnosis not present

## 2020-10-11 DIAGNOSIS — D0362 Melanoma in situ of left upper limb, including shoulder: Secondary | ICD-10-CM | POA: Diagnosis not present

## 2020-10-11 DIAGNOSIS — Z08 Encounter for follow-up examination after completed treatment for malignant neoplasm: Secondary | ICD-10-CM | POA: Diagnosis not present

## 2020-10-11 DIAGNOSIS — D225 Melanocytic nevi of trunk: Secondary | ICD-10-CM | POA: Diagnosis not present

## 2020-10-11 DIAGNOSIS — Z85820 Personal history of malignant melanoma of skin: Secondary | ICD-10-CM | POA: Diagnosis not present

## 2020-10-11 DIAGNOSIS — L570 Actinic keratosis: Secondary | ICD-10-CM | POA: Diagnosis not present

## 2020-10-11 DIAGNOSIS — X32XXXD Exposure to sunlight, subsequent encounter: Secondary | ICD-10-CM | POA: Diagnosis not present

## 2020-10-11 DIAGNOSIS — Z1283 Encounter for screening for malignant neoplasm of skin: Secondary | ICD-10-CM | POA: Diagnosis not present

## 2020-11-17 DIAGNOSIS — D0362 Melanoma in situ of left upper limb, including shoulder: Secondary | ICD-10-CM | POA: Diagnosis not present

## 2020-11-17 DIAGNOSIS — L989 Disorder of the skin and subcutaneous tissue, unspecified: Secondary | ICD-10-CM | POA: Diagnosis not present

## 2020-11-17 DIAGNOSIS — L905 Scar conditions and fibrosis of skin: Secondary | ICD-10-CM | POA: Diagnosis not present

## 2021-01-07 DIAGNOSIS — F411 Generalized anxiety disorder: Secondary | ICD-10-CM | POA: Diagnosis not present

## 2021-01-07 DIAGNOSIS — Z23 Encounter for immunization: Secondary | ICD-10-CM | POA: Diagnosis not present

## 2021-01-07 DIAGNOSIS — E785 Hyperlipidemia, unspecified: Secondary | ICD-10-CM | POA: Diagnosis not present

## 2021-01-24 DIAGNOSIS — D225 Melanocytic nevi of trunk: Secondary | ICD-10-CM | POA: Diagnosis not present

## 2021-01-24 DIAGNOSIS — C44519 Basal cell carcinoma of skin of other part of trunk: Secondary | ICD-10-CM | POA: Diagnosis not present

## 2021-01-24 DIAGNOSIS — Z85820 Personal history of malignant melanoma of skin: Secondary | ICD-10-CM | POA: Diagnosis not present

## 2021-01-24 DIAGNOSIS — L570 Actinic keratosis: Secondary | ICD-10-CM | POA: Diagnosis not present

## 2021-01-24 DIAGNOSIS — Z1283 Encounter for screening for malignant neoplasm of skin: Secondary | ICD-10-CM | POA: Diagnosis not present

## 2021-01-24 DIAGNOSIS — X32XXXD Exposure to sunlight, subsequent encounter: Secondary | ICD-10-CM | POA: Diagnosis not present

## 2021-01-24 DIAGNOSIS — Z08 Encounter for follow-up examination after completed treatment for malignant neoplasm: Secondary | ICD-10-CM | POA: Diagnosis not present

## 2021-04-25 DIAGNOSIS — Z85820 Personal history of malignant melanoma of skin: Secondary | ICD-10-CM | POA: Diagnosis not present

## 2021-04-25 DIAGNOSIS — Z08 Encounter for follow-up examination after completed treatment for malignant neoplasm: Secondary | ICD-10-CM | POA: Diagnosis not present

## 2021-04-25 DIAGNOSIS — Z1283 Encounter for screening for malignant neoplasm of skin: Secondary | ICD-10-CM | POA: Diagnosis not present

## 2021-04-25 DIAGNOSIS — X32XXXD Exposure to sunlight, subsequent encounter: Secondary | ICD-10-CM | POA: Diagnosis not present

## 2021-04-25 DIAGNOSIS — L570 Actinic keratosis: Secondary | ICD-10-CM | POA: Diagnosis not present

## 2021-06-06 IMAGING — CT CT CARDIAC CORONARY ARTERY CALCIUM SCORE
1 of 4 series · 9 of 20 positions shown, 12 images · non-contrast
Comparison: None.

Addendum:
CLINICAL DATA: Risk stratification

EXAM:
Coronary Calcium Score
TECHNIQUE: The patient was scanned on a Siemens Somatom 64 slice scanner. Axial
non-contrast 3 mm slices were carried out through the heart. The
data set was analyzed on a dedicated work station and scored using
the Agatson method.

[Series 6: ax thins · axial · 0.70mm/px · z∈[+940,+1043]mm · 9 of 185 slices shown, 12 images]
[im 19/185  vessel]
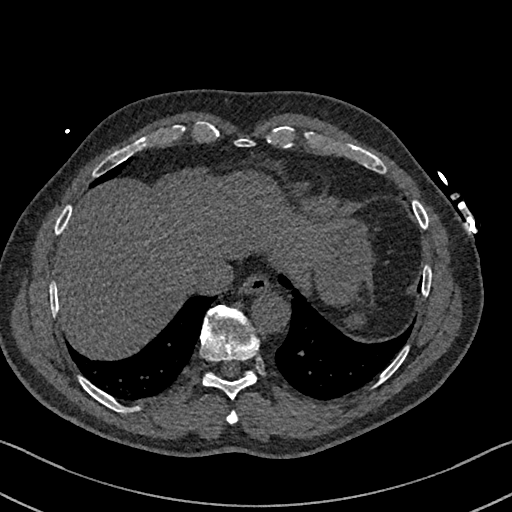
[im 19/185  lung]
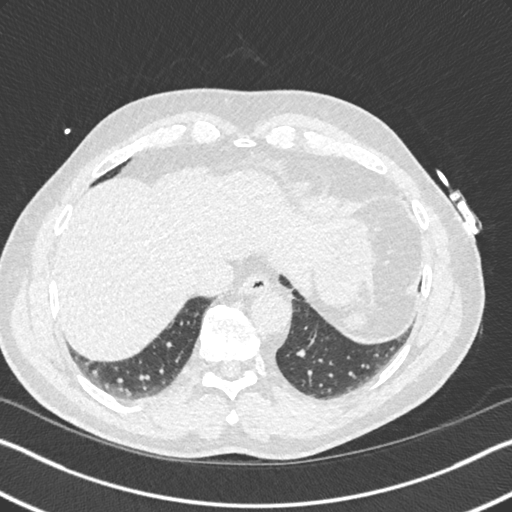
[im 37/185  vessel]
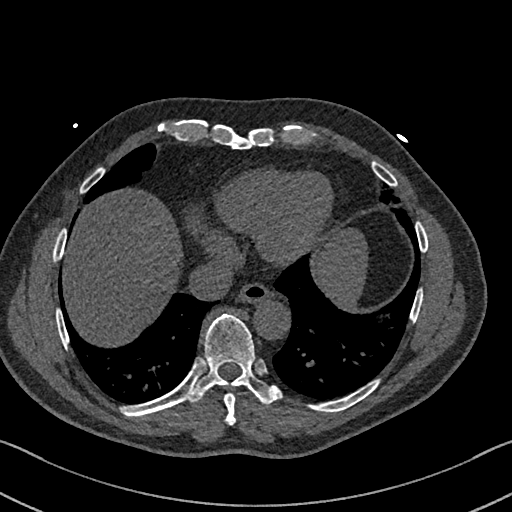
[im 56/185  vessel]
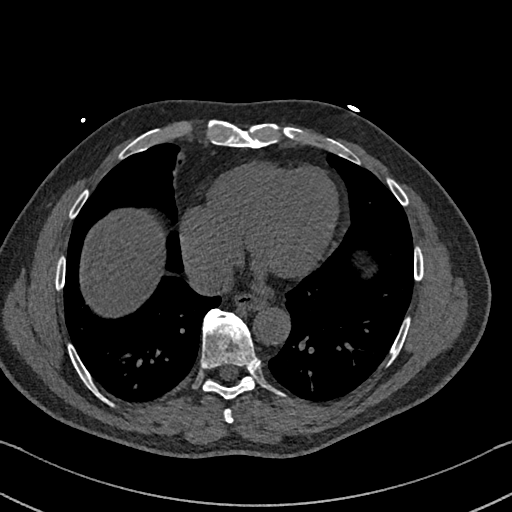
[im 74/185  vessel]
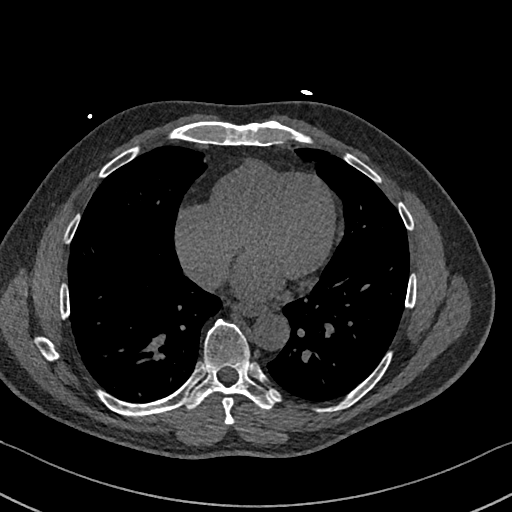
[im 93/185  vessel]
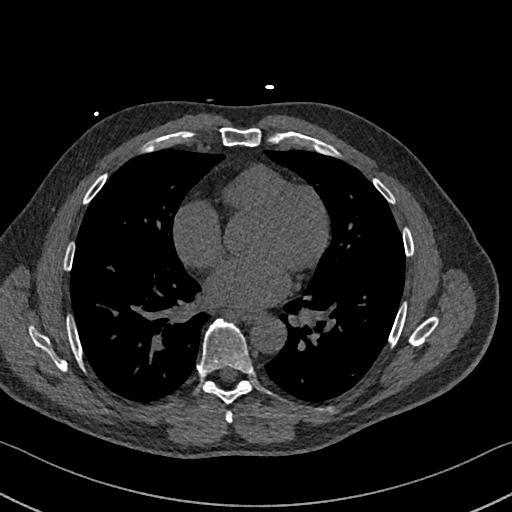
[im 93/185  lung]
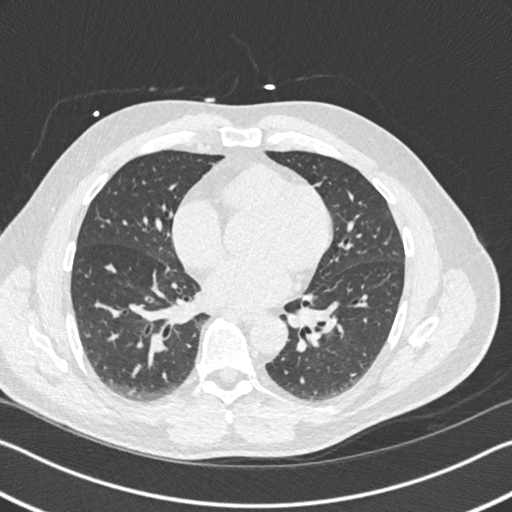
[im 111/185  vessel]
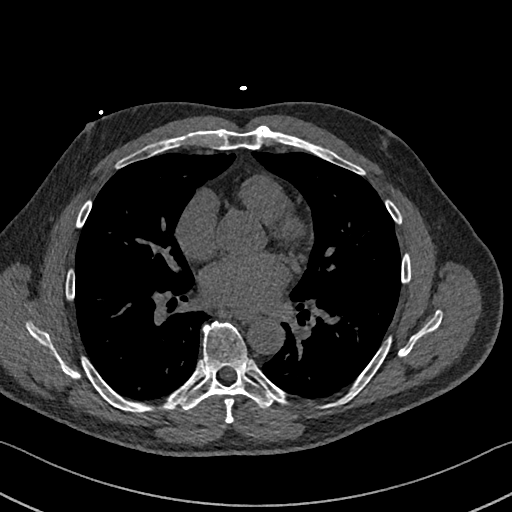
[im 129/185  vessel]
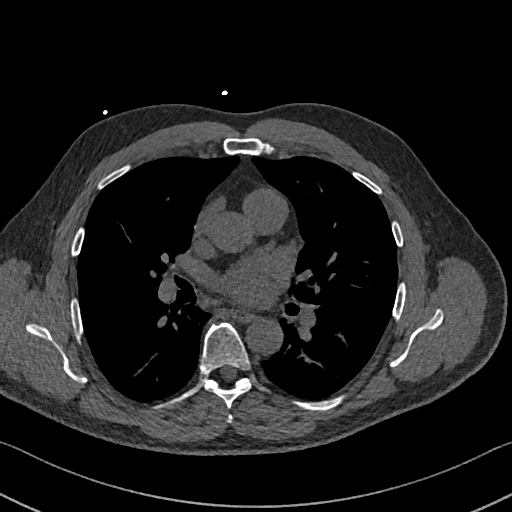
[im 148/185  vessel]
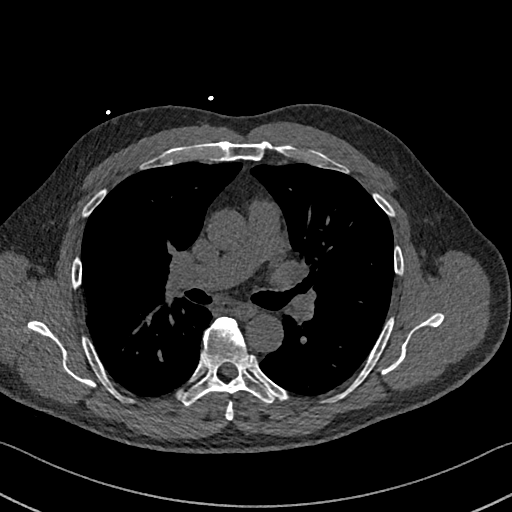
[im 166/185  vessel]
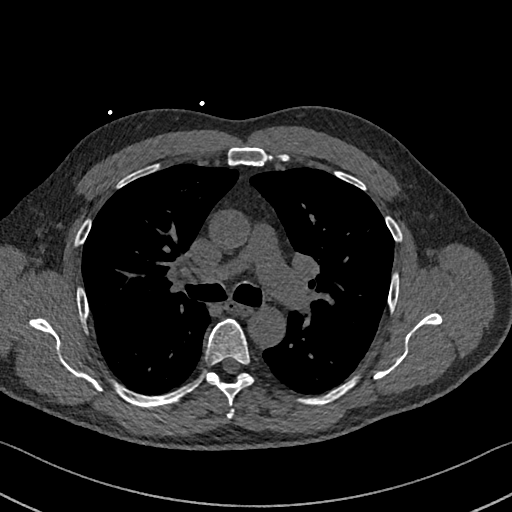
[im 166/185  lung]
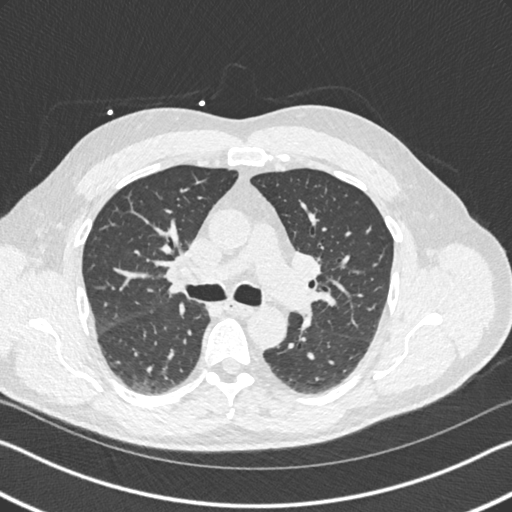

[9 of 20 positions shown; findings below may reference images not displayed]

FINDINGS: Non-cardiac: See separate report from [REDACTED].

Ascending aorta: Normal size

Pericardium: Normal

Coronary arteries: Normal origin
IMPRESSION: Coronary calcium score of 0. This was 0 percentile for age and sex
matched control.

Malc Shovlin

EXAM:
OVER-READ INTERPRETATION  CT CHEST

The following report is an over-read performed by radiologist Dr.
over-read does not include interpretation of cardiac or coronary
anatomy or pathology. The coronary calcium score interpretation by
the cardiologist is attached.
FINDINGS: Within the visualized portions of the thorax there are no suspicious
appearing pulmonary nodules or masses, there is no acute
consolidative airspace disease, no pleural effusions, no
pneumothorax and no lymphadenopathy. Visualized portions of the
upper abdomen are unremarkable. There are no aggressive appearing
lytic or blastic lesions noted in the visualized portions of the
skeleton.
IMPRESSION: No significant incidental noncardiac findings are noted.

*** End of Addendum ***
FINDINGS: Non-cardiac: See separate report from [REDACTED].

Ascending aorta: Normal size

Pericardium: Normal

Coronary arteries: Normal origin
IMPRESSION: Coronary calcium score of 0. This was 0 percentile for age and sex
matched control.

Malc Shovlin

## 2021-07-01 DIAGNOSIS — F419 Anxiety disorder, unspecified: Secondary | ICD-10-CM | POA: Diagnosis not present

## 2021-07-01 DIAGNOSIS — E785 Hyperlipidemia, unspecified: Secondary | ICD-10-CM | POA: Diagnosis not present

## 2021-07-01 DIAGNOSIS — Z79899 Other long term (current) drug therapy: Secondary | ICD-10-CM | POA: Diagnosis not present

## 2021-07-01 DIAGNOSIS — Z125 Encounter for screening for malignant neoplasm of prostate: Secondary | ICD-10-CM | POA: Diagnosis not present

## 2021-07-01 DIAGNOSIS — E291 Testicular hypofunction: Secondary | ICD-10-CM | POA: Diagnosis not present

## 2021-07-08 DIAGNOSIS — E785 Hyperlipidemia, unspecified: Secondary | ICD-10-CM | POA: Diagnosis not present

## 2021-07-08 DIAGNOSIS — Z0000 Encounter for general adult medical examination without abnormal findings: Secondary | ICD-10-CM | POA: Diagnosis not present

## 2021-07-08 DIAGNOSIS — Z6830 Body mass index (BMI) 30.0-30.9, adult: Secondary | ICD-10-CM | POA: Diagnosis not present

## 2021-07-08 DIAGNOSIS — F411 Generalized anxiety disorder: Secondary | ICD-10-CM | POA: Diagnosis not present

## 2021-07-08 DIAGNOSIS — E291 Testicular hypofunction: Secondary | ICD-10-CM | POA: Diagnosis not present

## 2021-08-08 DIAGNOSIS — D225 Melanocytic nevi of trunk: Secondary | ICD-10-CM | POA: Diagnosis not present

## 2021-08-08 DIAGNOSIS — L570 Actinic keratosis: Secondary | ICD-10-CM | POA: Diagnosis not present

## 2021-08-08 DIAGNOSIS — Z08 Encounter for follow-up examination after completed treatment for malignant neoplasm: Secondary | ICD-10-CM | POA: Diagnosis not present

## 2021-08-08 DIAGNOSIS — X32XXXD Exposure to sunlight, subsequent encounter: Secondary | ICD-10-CM | POA: Diagnosis not present

## 2021-08-08 DIAGNOSIS — Z1283 Encounter for screening for malignant neoplasm of skin: Secondary | ICD-10-CM | POA: Diagnosis not present

## 2021-08-08 DIAGNOSIS — Z85820 Personal history of malignant melanoma of skin: Secondary | ICD-10-CM | POA: Diagnosis not present

## 2021-11-07 DIAGNOSIS — Z1283 Encounter for screening for malignant neoplasm of skin: Secondary | ICD-10-CM | POA: Diagnosis not present

## 2021-11-07 DIAGNOSIS — X32XXXD Exposure to sunlight, subsequent encounter: Secondary | ICD-10-CM | POA: Diagnosis not present

## 2021-11-07 DIAGNOSIS — Z08 Encounter for follow-up examination after completed treatment for malignant neoplasm: Secondary | ICD-10-CM | POA: Diagnosis not present

## 2021-11-07 DIAGNOSIS — Z85820 Personal history of malignant melanoma of skin: Secondary | ICD-10-CM | POA: Diagnosis not present

## 2021-11-07 DIAGNOSIS — D225 Melanocytic nevi of trunk: Secondary | ICD-10-CM | POA: Diagnosis not present

## 2021-11-07 DIAGNOSIS — L570 Actinic keratosis: Secondary | ICD-10-CM | POA: Diagnosis not present

## 2022-01-11 DIAGNOSIS — Z23 Encounter for immunization: Secondary | ICD-10-CM | POA: Diagnosis not present

## 2022-01-11 DIAGNOSIS — E291 Testicular hypofunction: Secondary | ICD-10-CM | POA: Diagnosis not present

## 2022-01-11 DIAGNOSIS — E785 Hyperlipidemia, unspecified: Secondary | ICD-10-CM | POA: Diagnosis not present

## 2022-01-11 DIAGNOSIS — F411 Generalized anxiety disorder: Secondary | ICD-10-CM | POA: Diagnosis not present

## 2022-02-13 DIAGNOSIS — Z23 Encounter for immunization: Secondary | ICD-10-CM | POA: Diagnosis not present

## 2022-07-06 DIAGNOSIS — E291 Testicular hypofunction: Secondary | ICD-10-CM | POA: Diagnosis not present

## 2022-07-06 DIAGNOSIS — F419 Anxiety disorder, unspecified: Secondary | ICD-10-CM | POA: Diagnosis not present

## 2022-07-06 DIAGNOSIS — E785 Hyperlipidemia, unspecified: Secondary | ICD-10-CM | POA: Diagnosis not present

## 2022-07-06 DIAGNOSIS — Z125 Encounter for screening for malignant neoplasm of prostate: Secondary | ICD-10-CM | POA: Diagnosis not present

## 2022-07-06 DIAGNOSIS — Z79899 Other long term (current) drug therapy: Secondary | ICD-10-CM | POA: Diagnosis not present

## 2022-07-06 DIAGNOSIS — N529 Male erectile dysfunction, unspecified: Secondary | ICD-10-CM | POA: Diagnosis not present

## 2022-07-13 DIAGNOSIS — F411 Generalized anxiety disorder: Secondary | ICD-10-CM | POA: Diagnosis not present

## 2022-07-13 DIAGNOSIS — Z0000 Encounter for general adult medical examination without abnormal findings: Secondary | ICD-10-CM | POA: Diagnosis not present

## 2022-07-13 DIAGNOSIS — Z85820 Personal history of malignant melanoma of skin: Secondary | ICD-10-CM | POA: Diagnosis not present

## 2022-07-13 DIAGNOSIS — E291 Testicular hypofunction: Secondary | ICD-10-CM | POA: Diagnosis not present

## 2022-07-13 DIAGNOSIS — E785 Hyperlipidemia, unspecified: Secondary | ICD-10-CM | POA: Diagnosis not present

## 2022-11-09 DIAGNOSIS — Z85820 Personal history of malignant melanoma of skin: Secondary | ICD-10-CM | POA: Diagnosis not present

## 2022-11-09 DIAGNOSIS — D225 Melanocytic nevi of trunk: Secondary | ICD-10-CM | POA: Diagnosis not present

## 2022-11-09 DIAGNOSIS — Z1283 Encounter for screening for malignant neoplasm of skin: Secondary | ICD-10-CM | POA: Diagnosis not present

## 2022-11-09 DIAGNOSIS — Z08 Encounter for follow-up examination after completed treatment for malignant neoplasm: Secondary | ICD-10-CM | POA: Diagnosis not present

## 2022-11-09 DIAGNOSIS — X32XXXD Exposure to sunlight, subsequent encounter: Secondary | ICD-10-CM | POA: Diagnosis not present

## 2022-11-09 DIAGNOSIS — L570 Actinic keratosis: Secondary | ICD-10-CM | POA: Diagnosis not present

## 2023-01-26 DIAGNOSIS — G4733 Obstructive sleep apnea (adult) (pediatric): Secondary | ICD-10-CM | POA: Diagnosis not present

## 2023-02-01 DIAGNOSIS — Z23 Encounter for immunization: Secondary | ICD-10-CM | POA: Diagnosis not present

## 2023-04-23 DIAGNOSIS — L570 Actinic keratosis: Secondary | ICD-10-CM | POA: Diagnosis not present

## 2023-04-23 DIAGNOSIS — Z85820 Personal history of malignant melanoma of skin: Secondary | ICD-10-CM | POA: Diagnosis not present

## 2023-04-23 DIAGNOSIS — Z08 Encounter for follow-up examination after completed treatment for malignant neoplasm: Secondary | ICD-10-CM | POA: Diagnosis not present

## 2023-04-23 DIAGNOSIS — C4442 Squamous cell carcinoma of skin of scalp and neck: Secondary | ICD-10-CM | POA: Diagnosis not present

## 2023-04-23 DIAGNOSIS — Z1283 Encounter for screening for malignant neoplasm of skin: Secondary | ICD-10-CM | POA: Diagnosis not present

## 2023-04-23 DIAGNOSIS — X32XXXD Exposure to sunlight, subsequent encounter: Secondary | ICD-10-CM | POA: Diagnosis not present

## 2023-04-23 DIAGNOSIS — D225 Melanocytic nevi of trunk: Secondary | ICD-10-CM | POA: Diagnosis not present

## 2023-05-23 ENCOUNTER — Encounter: Payer: Self-pay | Admitting: Primary Care

## 2023-05-23 ENCOUNTER — Ambulatory Visit: Payer: BC Managed Care – PPO | Admitting: Primary Care

## 2023-05-23 VITALS — BP 137/81 | HR 82 | Ht 66.0 in | Wt 197.0 lb

## 2023-05-23 DIAGNOSIS — G4730 Sleep apnea, unspecified: Secondary | ICD-10-CM

## 2023-05-23 NOTE — Patient Instructions (Signed)
 Sleep study showed evidence of severe obstructive sleep apnea, you had on average 45 apneic events per hour with oxygen level of 77%.  He spent approximately 1 hour with an oxygen level less than 88%.  Due to clinical symptoms and severity of your OSA with nocturnal hypoxemia rec to be started on auto CPAP.  Will place an order today through [place].  We discussed other treatment options and fire if you are not to CPAP.  As well as medication called Zepbound which Sood approved for obesity management in someone with sleep apnea.  Please discuss with your primary care.  Advised to wear CPAP nightly for 4 to 6 hours or longer/anytime you are sleeping.  Will need to follow-up in 31 to 90 days for compliance check and  Follow-up Approximately 8 weeks with Beth NP-can be virtual for CPAP compliance  CPAP and BIPAP Information CPAP and BIPAP use air pressure to keep your airways open and help you breathe well. CPAP and BIPAP use different amounts of pressure. Your health care provider will tell you whether CPAP or BIPAP would be best for you. CPAP stands for continuous positive airway pressure. With CPAP, the amount of pressure stays the same while you breathe in and out. BIPAP stands for bi-level positive airway pressure. With BIPAP, the amount of pressure will be higher when you breathe in and lower when you breathe out. This allows you to take bigger breaths. CPAP or BIPAP may be used in the hospital or at home. You may need to have a sleep study before your provider can order a device for you to use at home. What are the advantages? CPAP and BIPAP are most often used for obstructive sleep apnea to keep the airways from collapsing when the muscles relax during sleep. CPAP or BIPAP can be used if you have: Chronic obstructive pulmonary disease. Heart failure. Medical conditions that cause muscle weakness. Other problems that cause breathing to be shallow, weak, or difficult. What are the  risks? Your provider will talk with you about risks. These may include: Sores on your nose or face caused from the mask, prongs, or nasal pillows. Dry or stuffy nose or nosebleeds. Feeling gassy or bloated. Sinus or lung infection if the equipment is not cleaned well. When should CPAP or BIPAP be used? In most cases, CPAP or BIPAP is used during sleep at night or whenever the main sleep time happens. It's also used during naps. People with some medical conditions may need to wear the mask when they're awake. Follow instructions from your provider about when to use your CPAP or BIPAP. What happens during CPAP or BIPAP?  Both CPAP and BIPAP use a small machine that uses electricity to create air pressure. A long tube connects the device to a plastic mask. Air is blown through the mask into your nose or mouth. The amount of pressure that's used to blow the air can be adjusted. Your provider will set the pressure setting and help you find the best mask for you. Tips for using the mask There are different types and sizes of masks. If your mask does not fit well, talk with your provider about getting a different one. Some common types of masks include: Full face masks, which fit over the mouth and nose. Nasal masks, which fit over the nose. Nasal pillow or prong masks, which fit into the nostrils. The mask needs to be snug to your face, so some people feel trapped or closed in at  first. If you feel this way, you may need to get used to the mask. Hold the mask loosely over your nose or mouth and then gradually put the the mask on more snugly. Slowly increase the amount of time you use the mask. If you have trouble with your mask not fitting well or leaking, talk with your provider. Do not stop using the mask. Tips for using the device Follow instructions from your provider about how to and how often to use the device. For home use, CPAP and BIPAP devices come from home health care companies. There are  many different brands. Your health insurance company will help to decide which device you get. Keep the CPAP or BIPAP device and attachments clean. Ask your home health care company or check the instruction book for cleaning instructions. Make sure the humidifier is filled with germ-free (sterile) water and is working correctly. This will help prevent a dry or stuffy nose or nosebleeds. A nasal saline mist or spray may keep your nose from getting dry and sore. Do not eat or drink while the CPAP or BIPAP device is on. Food or drinks could get pushed into your lungs by the pressure of the CPAP or BIPAP. Follow these instructions at home: Take over-the-counter and prescription medicines only as told by your provider. Do not smoke, vape, or use nicotine or tobacco. Contact a health care provider if: You have redness or pressure sores on your head, face, mouth, or nose from the mask or headgear. You have trouble using the CPAP or BIPAP device. You have trouble going to sleep or staying asleep. Someone tells you that you snore even when wearing your CPAP or BIPAP device. Get help right away if: You have trouble breathing. You feel confused. These symptoms may be an emergency. Get help right away. Call 911. Do not wait to see if the symptoms will go away. Do not drive yourself to the hospital. This information is not intended to replace advice given to you by your health care provider. Make sure you discuss any questions you have with your health care provider. Document Revised: 07/12/2022 Document Reviewed: 07/12/2022 Elsevier Patient Education  2024 [employer].

## 2023-05-23 NOTE — Progress Notes (Signed)
 @Patient  ID: James [name], male    DOB: [birth year], 68 y.o.   MRN: [id number]  Chief Complaint  Patient presents with   Establish Care   Snoring    SNAP study 02/05/23    Referring provider: Carylon Perches, MD  HPI: 68 year old male, former smoked. PMH significant for left rotator cuff repair, atypical chest pain, rectal bleeding, OSA.  05/23/2023 Patient presents today for sleep consult.  He originally had a sleep study through [facility] ordered by his primary care provider due to symptoms of snoring and witnessed apnea.  Typical bedtime is between 11 PM and 12 AM.  It takes him on average 15 to 20 minutes to fall asleep.  He wakes up occasionally throughout the night.  He Sood 8 AM.  His weight is up 10 to 15 pounds.  He does not currently wear CPAP or oxygen.  Epworth score 5.  We discussions including weight loss, oral appliance, CPAP therapy referral to ENT for possible surgical options.  Patient had questions about inspire as well as weight loss medication for obesity.  We discussed indications for both.  Patient is in agreement to start CPAP and will discuss weight loss medication with his primary care provider, recommend down to which have been approved for the management of obesity/OSA.  Sleep testing HST 02/05/2023>> AHI 45.2 with SpO2 low 77%.  Patient spent 59 minutes with O2 less than 88%.   Allergies  Allergen Reactions   Codeine     Can take in limited amounts due to making hyper    Immunization History  Administered Date(s) Administered   Fluad Trivalent(High Dose 65+) 02/01/2023   Influenza Inj Mdck Quad Pf 01/25/2017, 04/12/2018, 01/07/2021   Influenza-Unspecified 01/24/2019   Tdap 07/08/2020    Past Medical History:  Diagnosis Date   Chest pain     Tobacco History: Social History   Tobacco Use  Smoking Status Former  Smokeless Tobacco Never   Counseling given: Not Answered   Outpatient Medications Prior to Visit  Medication Sig Dispense  Refill   escitalopram (LEXAPRO) 10 MG tablet Take 15 mg by mouth daily.      LORazepam (ATIVAN) 0.5 MG tablet Take 0.125 tablets by mouth at bedtime as needed for anxiety or sleep.      diphenhydrAMINE (EQ NIGHTTIME SLEEP AID) 25 MG tablet Take 25 mg by mouth at bedtime. EQUATE SLEEP AID     Wheat Dextrin (BENEFIBER DRINK MIX) PACK Take 4 g by mouth at bedtime.     No facility-administered medications prior to visit.    Review of Systems  Review of Systems  HENT: Negative.    Respiratory: Negative.    Cardiovascular: Negative.    Physical Exam  BP 137/81   Pulse 82   Ht 5\' 6"  (1.676 m)   Wt 197 lb (89.4 kg)   SpO2 93%   BMI 31.80 kg/m  Physical Exam Constitutional:      Appearance: Normal appearance.  HENT:     Head: Normocephalic and atraumatic.     Mouth/Throat:     Mouth: Mucous membranes are moist.     Pharynx: Oropharynx is clear.  Cardiovascular:     Rate and Rhythm: Normal rate and regular rhythm.  Pulmonary:     Effort: Pulmonary effort is normal.     Breath sounds: Normal breath sounds.  Neurological:     General: No focal deficit present.     Mental Status: He is alert and oriented to person, place,  and time. Mental status is at baseline.  Psychiatric:        Mood and Affect: Mood normal.        Behavior: Behavior normal.        Thought Content: Thought content normal.        Judgment: Judgment normal.      Lab Results:  CBC    Component Value Date/Time   HGB 16.1 07/25/2018 0720   HCT 46.7 07/25/2018 0720    BMET No results found for: "NA", "K", "CL", "CO2", "GLUCOSE", "BUN", "CREATININE", "CALCIUM", "GFRNONAA", "GFRAA"  BNP No results found for: "BNP"  ProBNP No results found for: "PROBNP"  Imaging: No results found.   Assessment & Plan:   1. Severe sleep apnea (Primary)   Severe obstructive sleep apnea Patient has symptoms of snoring and witnessed apnea.  HST on 02/05/2023 with evidence of severe obstructive sleep apnea, AHI  45.2 an hour with SpO2 of 77%.  Discussed treatment options including weight loss, oral appliance and CPAP therapy referral to ENT for possible scopes.  Recommending patient be started on CPAP due to current symptoms and severity of OSA, patient in agreement with plan.  He had additional questions about inspire as well as weight loss medications.  We discussed criteria for inspire candidacy as well as potential weight loss medications including Zepbound which is an approved for OSA/obesity.  Advised patient discuss with primary care regarding medication management.  Patient instructed to wear CPAP nightly for 6 hours or longer.  Recommend getting overnight oximetry test once patient is on PAP therapy to reassess oxygen levels.  Follow-up 31 to 90 days for CPAP compliance, virtual visit okay.   Glenford Bayley, NP 05/23/2023

## 2023-06-01 DIAGNOSIS — G4733 Obstructive sleep apnea (adult) (pediatric): Secondary | ICD-10-CM | POA: Diagnosis not present

## 2023-06-04 DIAGNOSIS — L570 Actinic keratosis: Secondary | ICD-10-CM | POA: Diagnosis not present

## 2023-06-04 DIAGNOSIS — Z85828 Personal history of other malignant neoplasm of skin: Secondary | ICD-10-CM | POA: Diagnosis not present

## 2023-06-04 DIAGNOSIS — Z08 Encounter for follow-up examination after completed treatment for malignant neoplasm: Secondary | ICD-10-CM | POA: Diagnosis not present

## 2023-06-04 DIAGNOSIS — X32XXXD Exposure to sunlight, subsequent encounter: Secondary | ICD-10-CM | POA: Diagnosis not present

## 2023-07-02 DIAGNOSIS — G4733 Obstructive sleep apnea (adult) (pediatric): Secondary | ICD-10-CM | POA: Diagnosis not present

## 2023-07-10 DIAGNOSIS — Z85820 Personal history of malignant melanoma of skin: Secondary | ICD-10-CM | POA: Diagnosis not present

## 2023-07-10 DIAGNOSIS — N529 Male erectile dysfunction, unspecified: Secondary | ICD-10-CM | POA: Diagnosis not present

## 2023-07-10 DIAGNOSIS — Z79899 Other long term (current) drug therapy: Secondary | ICD-10-CM | POA: Diagnosis not present

## 2023-07-10 DIAGNOSIS — F419 Anxiety disorder, unspecified: Secondary | ICD-10-CM | POA: Diagnosis not present

## 2023-07-10 DIAGNOSIS — E291 Testicular hypofunction: Secondary | ICD-10-CM | POA: Diagnosis not present

## 2023-07-10 DIAGNOSIS — Z125 Encounter for screening for malignant neoplasm of prostate: Secondary | ICD-10-CM | POA: Diagnosis not present

## 2023-07-17 DIAGNOSIS — Z85820 Personal history of malignant melanoma of skin: Secondary | ICD-10-CM | POA: Diagnosis not present

## 2023-07-17 DIAGNOSIS — E785 Hyperlipidemia, unspecified: Secondary | ICD-10-CM | POA: Diagnosis not present

## 2023-07-17 DIAGNOSIS — Z0001 Encounter for general adult medical examination with abnormal findings: Secondary | ICD-10-CM | POA: Diagnosis not present

## 2023-07-17 DIAGNOSIS — G4733 Obstructive sleep apnea (adult) (pediatric): Secondary | ICD-10-CM | POA: Diagnosis not present

## 2023-07-27 ENCOUNTER — Telehealth: Payer: BC Managed Care – PPO | Admitting: Primary Care

## 2023-07-27 ENCOUNTER — Encounter: Payer: Self-pay | Admitting: Primary Care

## 2023-07-27 DIAGNOSIS — Z87891 Personal history of nicotine dependence: Secondary | ICD-10-CM | POA: Diagnosis not present

## 2023-07-27 DIAGNOSIS — G4733 Obstructive sleep apnea (adult) (pediatric): Secondary | ICD-10-CM | POA: Diagnosis not present

## 2023-07-27 DIAGNOSIS — G4730 Sleep apnea, unspecified: Secondary | ICD-10-CM

## 2023-07-27 NOTE — Progress Notes (Signed)
 Virtual Visit via Video Note  I connected with James [name] on 07/27/23 at  2:30 PM EDT by a video enabled telemedicine application and verified that I am speaking with the correct person using two identifiers.  Location: Patient: Home Provider: Office    I discussed the limitations of evaluation and management by telemedicine and the availability of in person appointments. The patient expressed understanding and agreed to proceed.  History of Present Illness: 68 year old male, former smoked. PMH significant for left rotator cuff repair, atypical chest pain, rectal bleeding, OSA.  Previous [facility] pulmonary encounter: 05/23/2023 Patient presents today for sleep consult.  He originally had a sleep study through [facility] ordered by his primary care provider due to symptoms of snoring and witnessed apnea.  Typical bedtime is between 11 PM and 12 AM.  It takes him on average 15 to 20 minutes to fall asleep.  He wakes up occasionally throughout the night.  He Sood 8 AM.  His weight is up 10 to 15 pounds.  He does not currently wear CPAP or oxygen.  Epworth score 5.  We discussions including weight loss, oral appliance, CPAP therapy referral to ENT for possible surgical options.  Patient had questions about inspire as well as weight loss medication for obesity.  We discussed indications for both.  Patient is in agreement to start CPAP and will discuss weight loss medication with his primary care provider, recommend down to which have been approved for the management of obesity/OSA.  07/27/2023- Interim hx  Discussed the use of AI scribe software for clinical note transcription with the patient, who gave verbal consent to proceed.  Patient contacted today for CPAP compliance check.  Patient had a home sleep study in November 2024 that showed severe obstructive sleep apnea, average apnea hypopnea index score 45 events per hour.  Patient started on auto CPAP.  Compliance download shows patient is  100% compliant with CPAP, current pressure 5 to 20 cm H2O with residual AHI 2.1.  He has been using the CPAP machine consistently every night for approximately 7 hours and 21 minutes. The CPAP is set to auto-adjust with a minimum pressure of 5 and a maximum of 20, with an average pressure of 8 being used. There are no significant air leaks reported.  He experiences improved sleep quality and reduced daytime fatigue. His wife has noticed a reduction in his snoring, and both he and his wife are sleeping better. He is no longer tossing and turning at night.      Airview download 06/26/2023-07/25/23 Usage days 30/30 days (100%) greater than 4 hours Average usage 7 hours 21 minutes Pressure 5 to 20 cm H2O (8.8 cm H2O-95%) Air leaks 15.8 L/min (95%) AHI 2.1  Observations/Objective:  -- Unable to log into video visit, changed to televisit   -- Able to speak in full sentences without overt respiratory symptoms  Sleep testing HST 02/05/2023>> AHI 45.2 with SpO2 low 77%.  Patient spent 59 minutes with O2 less than 88%.  Assessment and Plan:  1. Severe sleep apnea (Primary)  Assessment and Plan    Obstructive Sleep Apnea Severe obstructive sleep apnea well-controlled with CPAP therapy. Patient is 100% compliant with CPAP use over the last 30 days. Current pressure 5-20cm h20; Apneic events reduced to 2.1 per hour. Patent reports benefit in sleep quality and fatigue with CPAP use. No issues with machine or mask.  - Continue CPAP therapy nightly. - Schedule follow-up in 6 months for CPAP compliance  check and supply renewal. - Instruct him to contact the office if any issues arise before the next scheduled visit.     Follow Up Instructions:  6 month CPAP compliance check in    I discussed the assessment and treatment plan with the patient. The patient was provided an opportunity to ask questions and all were answered. The patient agreed with the plan and demonstrated an understanding of the  instructions.   The patient was advised to call back or seek an in-person evaluation if the symptoms worsen or if the condition fails to improve as anticipated.  I provided 22 minutes of non-face-to-face time during this encounter.   Antonio Baumgarten, NP

## 2023-09-01 DIAGNOSIS — G4733 Obstructive sleep apnea (adult) (pediatric): Secondary | ICD-10-CM | POA: Diagnosis not present

## 2023-09-24 DIAGNOSIS — Z85820 Personal history of malignant melanoma of skin: Secondary | ICD-10-CM | POA: Diagnosis not present

## 2023-09-24 DIAGNOSIS — C44629 Squamous cell carcinoma of skin of left upper limb, including shoulder: Secondary | ICD-10-CM | POA: Diagnosis not present

## 2023-09-24 DIAGNOSIS — Z08 Encounter for follow-up examination after completed treatment for malignant neoplasm: Secondary | ICD-10-CM | POA: Diagnosis not present

## 2023-11-01 DIAGNOSIS — G4733 Obstructive sleep apnea (adult) (pediatric): Secondary | ICD-10-CM | POA: Diagnosis not present

## 2023-12-02 DIAGNOSIS — G4733 Obstructive sleep apnea (adult) (pediatric): Secondary | ICD-10-CM | POA: Diagnosis not present

## 2024-01-01 DIAGNOSIS — G4733 Obstructive sleep apnea (adult) (pediatric): Secondary | ICD-10-CM | POA: Diagnosis not present

## 2024-03-02 DIAGNOSIS — G4733 Obstructive sleep apnea (adult) (pediatric): Secondary | ICD-10-CM | POA: Diagnosis not present
